# Patient Record
Sex: Male | Born: 1960 | Race: Asian | Hispanic: No | Marital: Married | Smoking: Never smoker
Health system: Southern US, Community
[De-identification: ages and names within clinical notes are randomized; demographics above are authoritative.]

## PROBLEM LIST (undated history)

## (undated) DIAGNOSIS — I10 Essential (primary) hypertension: Secondary | ICD-10-CM

---

## 2003-12-21 ENCOUNTER — Emergency Department (HOSPITAL_COMMUNITY): Admission: EM | Admit: 2003-12-21 | Discharge: 2003-12-21 | Payer: Self-pay | Admitting: Emergency Medicine

## 2004-05-01 ENCOUNTER — Ambulatory Visit: Payer: Self-pay | Admitting: Internal Medicine

## 2004-05-13 ENCOUNTER — Ambulatory Visit: Payer: Self-pay | Admitting: Family Medicine

## 2004-05-29 ENCOUNTER — Ambulatory Visit: Payer: Self-pay | Admitting: Internal Medicine

## 2004-12-01 ENCOUNTER — Ambulatory Visit: Payer: Self-pay | Admitting: Internal Medicine

## 2004-12-10 ENCOUNTER — Ambulatory Visit: Payer: Self-pay | Admitting: Family Medicine

## 2004-12-11 ENCOUNTER — Ambulatory Visit: Payer: Self-pay | Admitting: *Deleted

## 2005-03-02 ENCOUNTER — Ambulatory Visit: Payer: Self-pay | Admitting: Family Medicine

## 2005-05-05 ENCOUNTER — Ambulatory Visit: Payer: Self-pay | Admitting: Family Medicine

## 2005-09-15 ENCOUNTER — Ambulatory Visit: Payer: Self-pay | Admitting: Family Medicine

## 2006-01-15 ENCOUNTER — Ambulatory Visit: Payer: Self-pay | Admitting: Family Medicine

## 2006-01-26 ENCOUNTER — Ambulatory Visit: Payer: Self-pay | Admitting: Family Medicine

## 2006-06-04 ENCOUNTER — Ambulatory Visit: Payer: Self-pay | Admitting: Internal Medicine

## 2006-11-05 ENCOUNTER — Ambulatory Visit: Payer: Self-pay | Admitting: Internal Medicine

## 2006-11-05 ENCOUNTER — Encounter (INDEPENDENT_AMBULATORY_CARE_PROVIDER_SITE_OTHER): Payer: Self-pay | Admitting: Family Medicine

## 2006-11-05 LAB — CONVERTED CEMR LAB: Hgb A1c MFr Bld: 5.6 %

## 2006-12-13 ENCOUNTER — Encounter (INDEPENDENT_AMBULATORY_CARE_PROVIDER_SITE_OTHER): Payer: Self-pay | Admitting: Internal Medicine

## 2007-01-22 ENCOUNTER — Encounter (INDEPENDENT_AMBULATORY_CARE_PROVIDER_SITE_OTHER): Payer: Self-pay | Admitting: Family Medicine

## 2007-01-22 DIAGNOSIS — J309 Allergic rhinitis, unspecified: Secondary | ICD-10-CM | POA: Insufficient documentation

## 2007-01-22 DIAGNOSIS — E119 Type 2 diabetes mellitus without complications: Secondary | ICD-10-CM

## 2007-01-22 DIAGNOSIS — I517 Cardiomegaly: Secondary | ICD-10-CM

## 2007-01-22 DIAGNOSIS — L408 Other psoriasis: Secondary | ICD-10-CM

## 2007-01-22 DIAGNOSIS — E1165 Type 2 diabetes mellitus with hyperglycemia: Secondary | ICD-10-CM | POA: Insufficient documentation

## 2007-01-22 LAB — CONVERTED CEMR LAB
Cholesterol: 122 mg/dL
Microalb, Ur: 0.4 mg/dL

## 2007-05-18 ENCOUNTER — Encounter (INDEPENDENT_AMBULATORY_CARE_PROVIDER_SITE_OTHER): Payer: Self-pay | Admitting: *Deleted

## 2007-06-24 ENCOUNTER — Ambulatory Visit: Payer: Self-pay | Admitting: Internal Medicine

## 2007-06-24 DIAGNOSIS — K921 Melena: Secondary | ICD-10-CM

## 2007-06-24 DIAGNOSIS — F911 Conduct disorder, childhood-onset type: Secondary | ICD-10-CM | POA: Insufficient documentation

## 2007-06-24 DIAGNOSIS — L738 Other specified follicular disorders: Secondary | ICD-10-CM

## 2007-06-24 DIAGNOSIS — E785 Hyperlipidemia, unspecified: Secondary | ICD-10-CM

## 2007-06-24 DIAGNOSIS — R3915 Urgency of urination: Secondary | ICD-10-CM

## 2007-06-24 LAB — CONVERTED CEMR LAB
Bilirubin Urine: NEGATIVE
Blood in Urine, dipstick: NEGATIVE
Ketones, urine, test strip: NEGATIVE
Urobilinogen, UA: NEGATIVE
WBC Urine, dipstick: NEGATIVE

## 2007-07-05 LAB — CONVERTED CEMR LAB
ALT: 23 units/L (ref 0–53)
Basophils Absolute: 0 10*3/uL (ref 0.0–0.1)
Calcium: 8.7 mg/dL (ref 8.4–10.5)
Cholesterol: 142 mg/dL (ref 0–200)
HDL: 35 mg/dL — ABNORMAL LOW (ref >39)
LDL Cholesterol: 78 mg/dL (ref 0–99)
Lymphocytes Relative: 33 % (ref 12–46)
Lymphs Abs: 2.2 10*3/uL (ref 0.7–3.3)
Monocytes Absolute: 0.4 10*3/uL (ref 0.2–0.7)
Monocytes Relative: 6 % (ref 3–11)
Neutro Abs: 3.9 10*3/uL (ref 1.7–7.7)
Neutrophils Relative %: 60 % (ref 43–77)
Platelets: 214 10*3/uL (ref 150–400)
RBC: 5.62 M/uL (ref 4.22–5.81)
RDW: 14.7 % — ABNORMAL HIGH (ref 11.5–14.0)
Sodium: 139 meq/L (ref 135–145)
Total CHOL/HDL Ratio: 4.1
Total Protein: 7.8 g/dL (ref 6.0–8.3)
Triglycerides: 144 mg/dL (ref <150)
VLDL: 29 mg/dL (ref 0–40)
WBC: 6.6 10*3/uL (ref 4.0–10.5)

## 2007-07-08 ENCOUNTER — Ambulatory Visit: Payer: Self-pay | Admitting: Internal Medicine

## 2007-10-14 ENCOUNTER — Emergency Department (HOSPITAL_COMMUNITY): Admission: EM | Admit: 2007-10-14 | Discharge: 2007-10-14 | Payer: Self-pay | Admitting: Emergency Medicine

## 2008-09-03 ENCOUNTER — Telehealth (INDEPENDENT_AMBULATORY_CARE_PROVIDER_SITE_OTHER): Payer: Self-pay | Admitting: Internal Medicine

## 2008-09-25 ENCOUNTER — Ambulatory Visit: Payer: Self-pay | Admitting: Internal Medicine

## 2008-09-25 DIAGNOSIS — I10 Essential (primary) hypertension: Secondary | ICD-10-CM | POA: Insufficient documentation

## 2008-09-25 LAB — CONVERTED CEMR LAB
Blood Glucose, AC Bkfst: 108 mg/dL
Blood in Urine, dipstick: NEGATIVE
Ketones, urine, test strip: NEGATIVE
Specific Gravity, Urine: >=1.03

## 2008-10-02 ENCOUNTER — Encounter (INDEPENDENT_AMBULATORY_CARE_PROVIDER_SITE_OTHER): Payer: Self-pay | Admitting: Internal Medicine

## 2008-10-03 LAB — CONVERTED CEMR LAB
ALT: 26 units/L (ref 0–53)
Alkaline Phosphatase: 65 units/L (ref 39–117)
Basophils Absolute: 0 10*3/uL (ref 0.0–0.1)
CO2: 20 meq/L (ref 19–32)
Calcium: 9 mg/dL (ref 8.4–10.5)
Chloride: 106 meq/L (ref 96–112)
Cholesterol: 158 mg/dL (ref 0–200)
Eosinophils Absolute: 0.1 10*3/uL (ref 0.0–0.7)
Eosinophils Relative: 1 % (ref 0–5)
Glucose, Bld: 101 mg/dL — ABNORMAL HIGH (ref 70–99)
HCT: 49.5 % (ref 39.0–52.0)
HDL: 37 mg/dL — ABNORMAL LOW (ref >39)
LDL Cholesterol: 90 mg/dL (ref 0–99)
Lymphocytes Relative: 29 % (ref 12–46)
Neutro Abs: 4.2 10*3/uL (ref 1.7–7.7)
Platelets: 226 10*3/uL (ref 150–400)
RBC: 5.82 M/uL — ABNORMAL HIGH (ref 4.22–5.81)
RDW: 15 % (ref 11.5–15.5)
Total Bilirubin: 0.4 mg/dL (ref 0.3–1.2)
VLDL: 31 mg/dL (ref 0–40)

## 2008-10-08 ENCOUNTER — Ambulatory Visit: Payer: Self-pay | Admitting: Internal Medicine

## 2008-10-08 LAB — CONVERTED CEMR LAB
CO2: 25 meq/L (ref 19–32)
Chloride: 106 meq/L (ref 96–112)
Creatinine, Ser: 0.9 mg/dL (ref 0.40–1.50)

## 2008-11-27 ENCOUNTER — Ambulatory Visit: Payer: Self-pay | Admitting: Internal Medicine

## 2008-11-27 LAB — CONVERTED CEMR LAB
Bilirubin Urine: NEGATIVE
Blood Glucose, Fingerstick: 131
Glucose, Urine, Semiquant: NEGATIVE
Ketones, urine, test strip: NEGATIVE

## 2009-01-25 ENCOUNTER — Ambulatory Visit: Payer: Self-pay | Admitting: Internal Medicine

## 2009-02-05 LAB — CONVERTED CEMR LAB
Cholesterol: 134 mg/dL (ref 0–200)
HDL: 31 mg/dL — ABNORMAL LOW (ref >39)
LDL Cholesterol: 50 mg/dL (ref 0–99)
Microalb, Ur: 1.99 mg/dL — ABNORMAL HIGH (ref 0.00–1.89)
Total CHOL/HDL Ratio: 4.3
Triglycerides: 265 mg/dL — ABNORMAL HIGH (ref <150)

## 2009-05-24 ENCOUNTER — Ambulatory Visit: Payer: Self-pay | Admitting: Internal Medicine

## 2009-05-24 LAB — CONVERTED CEMR LAB
Blood Glucose, Fingerstick: 126
Hgb A1c MFr Bld: 7.2 %

## 2009-06-11 LAB — CONVERTED CEMR LAB: VLDL: 33 mg/dL (ref 0–40)

## 2009-10-29 ENCOUNTER — Ambulatory Visit: Payer: Self-pay | Admitting: Internal Medicine

## 2009-10-29 LAB — CONVERTED CEMR LAB
Cholesterol: 147 mg/dL (ref 0–200)
HDL: 34 mg/dL — ABNORMAL LOW (ref >39)
Hgb A1c MFr Bld: 7.3 %
LDL Cholesterol: 78 mg/dL (ref 0–99)
Total CHOL/HDL Ratio: 4.3
Triglycerides: 176 mg/dL — ABNORMAL HIGH (ref <150)
VLDL: 35 mg/dL (ref 0–40)

## 2009-11-02 ENCOUNTER — Encounter (INDEPENDENT_AMBULATORY_CARE_PROVIDER_SITE_OTHER): Payer: Self-pay | Admitting: Internal Medicine

## 2009-12-05 ENCOUNTER — Telehealth (INDEPENDENT_AMBULATORY_CARE_PROVIDER_SITE_OTHER): Payer: Self-pay | Admitting: Internal Medicine

## 2009-12-09 ENCOUNTER — Ambulatory Visit: Payer: Self-pay | Admitting: Internal Medicine

## 2010-01-22 ENCOUNTER — Telehealth (INDEPENDENT_AMBULATORY_CARE_PROVIDER_SITE_OTHER): Payer: Self-pay | Admitting: Internal Medicine

## 2010-05-01 ENCOUNTER — Ambulatory Visit: Payer: Self-pay | Admitting: Internal Medicine

## 2010-05-01 LAB — CONVERTED CEMR LAB
Albumin: 4.3 g/dL (ref 3.5–5.2)
BUN: 15 mg/dL (ref 6–23)
Basophils Absolute: 0 10*3/uL (ref 0.0–0.1)
Basophils Relative: 0 % (ref 0–1)
Blood Glucose, Fingerstick: 170
Calcium: 9.2 mg/dL (ref 8.4–10.5)
Eosinophils Relative: 2 % (ref 0–5)
Glucose, Bld: 142 mg/dL — ABNORMAL HIGH (ref 70–99)
Hgb A1c MFr Bld: 7.2 % — ABNORMAL HIGH (ref <5.7)
LDL Cholesterol: 72 mg/dL (ref 0–99)
Lymphocytes Relative: 28 % (ref 12–46)
Lymphs Abs: 2 10*3/uL (ref 0.7–4.0)
Microalb, Ur: 2.14 mg/dL — ABNORMAL HIGH (ref 0.00–1.89)
Monocytes Absolute: 0.5 10*3/uL (ref 0.1–1.0)
Neutro Abs: 4.5 10*3/uL (ref 1.7–7.7)
Neutrophils Relative %: 63 % (ref 43–77)
PSA: 0.25 ng/mL (ref 0.10–4.00)
Sodium: 138 meq/L (ref 135–145)
Total CHOL/HDL Ratio: 5.1
VLDL: 48 mg/dL — ABNORMAL HIGH (ref 0–40)

## 2010-05-07 ENCOUNTER — Encounter (INDEPENDENT_AMBULATORY_CARE_PROVIDER_SITE_OTHER): Payer: Self-pay | Admitting: Internal Medicine

## 2010-05-28 ENCOUNTER — Encounter (INDEPENDENT_AMBULATORY_CARE_PROVIDER_SITE_OTHER): Payer: Self-pay | Admitting: Internal Medicine

## 2010-06-13 ENCOUNTER — Telehealth (INDEPENDENT_AMBULATORY_CARE_PROVIDER_SITE_OTHER): Payer: Self-pay | Admitting: Internal Medicine

## 2010-08-28 ENCOUNTER — Telehealth (INDEPENDENT_AMBULATORY_CARE_PROVIDER_SITE_OTHER): Payer: Self-pay | Admitting: Internal Medicine

## 2010-10-02 NOTE — Progress Notes (Signed)
Summary: Office Visit/DEPRESSION SCREENING  Office Visit/DEPRESSION SCREENING   Imported By: Arta Bruce 05/02/2010 10:00:57  _____________________________________________________________________  External Attachment:    Type:   Image     Comment:   External Document

## 2010-10-02 NOTE — Letter (Signed)
Summary: [facility]  [facility]   Imported By: Arta Bruce 07/31/2010 15:20:38  _____________________________________________________________________  External Attachment:    Type:   Image     Comment:   External Document

## 2010-10-02 NOTE — Progress Notes (Signed)
Summary: MEDS REFILL  Phone Note Refill Request   Refills Requested: Medication #1:  METFORMIN HCL 500 MG TABS 1 by mouth two times a day  Medication #2:  LISINOPRIL 10 MG TABS 1 tab by mouth daily [facility] [address] [facility] GENETIC [facility]AND   Initial call taken by: Domenic Polite,  August 28, 2010 8:33 AM  Follow-up for Phone Call        Still has refills available -- [facility] [address] called to transfer meds from [facility] -- will be ready later today.  Pt. notified.  Dutch Quint RN  August 28, 2010 10:08 AM

## 2010-10-02 NOTE — Letter (Signed)
Summary: Lipid Letter  [facility]  [address] [place], [place] [address]   Phone: [phone]  Fax: [phone]    11/02/2009  Julian Riley [address] [place], [place]  [address]  Dear Julian Riley:  We have carefully reviewed your last lipid profile from 10/29/2009 and the results are noted below with a summary of recommendations for lipid management.    Cholesterol:       147     Goal: <200   HDL "good" Cholesterol:   34     Goal: >45   LDL "bad" Cholesterol:   78     Goal: <70   Triglycerides:       176     Goal: <150    No change in triglycerides, HDL or LDL.  Recommend starting the Lovaza and calling to schedule a repeat fasting cholesterol profile in 3 months    TLC Diet (Therapeutic Lifestyle Change): Saturated Fats & Transfatty acids should be kept < 7% of total calories ***Reduce Saturated Fats Polyunstaurated Fat can be up to 10% of total calories Monounsaturated Fat Fat can be up to 20% of total calories Total Fat should be no greater than 25-35% of total calories Carbohydrates should be 50-60% of total calories Protein should be approximately 15% of total calories Fiber should be at least 20-30 grams a day ***Increased fiber may help lower LDL Total Cholesterol should be < 200mg /day Consider adding plant stanol/sterols to diet (example: Benacol spread) ***A higher intake of unsaturated fat may reduce Triglycerides and Increase HDL    Adjunctive Measures (may lower LIPIDS and reduce risk of Heart Attack) include: Aerobic Exercise (20-30 minutes 3-4 times a week) Limit Alcohol Consumption Weight Reduction Aspirin 75-81 mg a day by mouth (if not allergic or contraindicated) Dietary Fiber 20-30 grams a day by mouth     Current Medications: 1)    Metformin Hcl 500 Mg Tabs (Metformin hcl) .Marland Kitchen.. 1 by mouth two times a day 2)    Clobetasol Propionate 0.05 %  Crea (Clobetasol propionate) .... Apply two times a day to affected areas prn 3)    Nasacort Aq  55 Mcg/act  Aers (Triamcinolone acetonide(nasal)) .... 2 sprays each nostril daily 4)    Lisinopril 10 Mg Tabs (Lisinopril) .Marland Kitchen.. 1 tab by mouth daily 5)    Claritin-d 12 Hour 5-120 Mg Xr12h-tab (Loratadine-pseudoephedrine) .Marland Kitchen.. 1 tab by mouth two times a day as needed allergies 6)    Lovaza 1 Gm Caps (Omega-3-acid ethyl esters) .... 4 caps by mouth daily  If you have any questions, please call. We appreciate being able to work with you.   Sincerely,    [facility] Julieanne Manson MD

## 2010-10-02 NOTE — Assessment & Plan Note (Signed)
Summary: CPE /TMM   Vital Signs:  Patient profile:   50 year old male Height:      69 inches (175.26 cm) Weight:      233 pounds (105.91 kg) BMI:     34.53 Temp:     97.8 degrees F (36.56 degrees C) oral Pulse rate:   72 / minute Pulse rhythm:   regular Resp:     20 per minute BP sitting:   122 / 80  (left arm)  Vitals Entered By: CMA Student Linzie Collin CC: office visit CPE Is Patient Diabetic? Yes Pain Assessment Patient in pain? no      CBG Result 170  Does patient need assistance? Functional Status Self care Ambulation Normal   CC:  office visit CPE.  History of Present Illness: 50 yo male here for CPE.  Current Medications (verified): 1)  Metformin Hcl 500 Mg Tabs (Metformin Hcl) .Marland Kitchen.. 1 By Mouth Two Times A Day 2)  Clobetasol Propionate 0.05 %  Crea (Clobetasol Propionate) .... Apply Two Times A Day To Affected Areas Prn 3)  Nasacort Aq 55 Mcg/act  Aers (Triamcinolone Acetonide(Nasal)) .... 2 Sprays Each Nostril Daily 4)  Lisinopril 10 Mg Tabs (Lisinopril) .Marland Kitchen.. 1 Tab By Mouth Daily 5)  Xyzal 5 Mg Tabs (Levocetirizine Dihydrochloride) .Marland Kitchen.. 1 Tab By Mouth Daily As Needed Allergies 6)  Lovaza 1 Gm Caps (Omega-3-Acid Ethyl Esters) .... 4 Caps By Mouth Daily  Allergies (verified): No Known Drug Allergies  Past History:  Past Medical History: Reviewed history from 11/27/2008 and no changes required. 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 . Eyes:  Stable--wears glasses.  To make an appt. with [facility].. ENT:  Denies decreased hearing. CV:  Denies chest pain or discomfort and palpitations. Resp:  Denies cough and shortness of breath. GI:  Denies abdominal pain, bloody stools, constipation, dark tarry stools, and diarrhea. GU:  Denies dysuria, nocturia, urinary frequency, and urinary hesitancy. MS:  Denies joint pain, joint redness, and joint swelling; Rare discomfort in left low back. Derm:  Dermatitis on feet, elbows, hands and knees, toes where has skin folds.. Neuro:  Denies numbness, tingling, and weakness. Psych:  Denies anxiety, depression, and suicidal thoughts/plans; PHQ9 scored 0.  Physical Exam  General:  Obese male, NAD Head:  Normocephalic and atraumatic without obvious abnormalities. No apparent alopecia or balding. Eyes:  Bilateral cataracts--quite large--difficult to visualize discs Ears:  External ear exam shows no significant lesions or deformities.  Otoscopic examination reveals clear canals, tympanic membranes are intact bilaterally without bulging, retraction, inflammation or discharge. Hearing is grossly normal bilaterally. Nose:  External nasal examination shows no deformity or inflammation. Nasal mucosa are pink and moist without lesions or exudates. Mouth:  Oral mucosa and oropharynx without lesions or exudates.  Teeth in good repair. Neck:  No deformities,  masses, or tenderness noted. Chest Wall:  No deformities, masses, tenderness or gynecomastia noted. Lungs:  Normal respiratory effort, chest  expands symmetrically. Lungs are clear to auscultation, no crackles or wheezes. Heart:  Normal rate and regular rhythm. S1 and S2 normal without gallop, murmur, click, rub or other extra sounds. Abdomen:  Bowel sounds positive,abdomen soft and non-tender without masses, organomegaly or hernias noted. Rectal:  No external abnormalities noted. Normal sphincter tone. No rectal masses or tenderness.  Heme negative light brown stool. Genitalia:  Testes bilaterally descended without nodularity, tenderness or masses. No scrotal masses or lesions. No penis lesions or urethral discharge. Prostate:  Prostate gland firm and smooth, no enlargement, nodularity, tenderness, mass, asymmetry or induration. Msk:  No deformity or scoliosis noted of thoracic or lumbar spine.   Pulses:  R and L carotid,radial,femoral,dorsalis pedis and posterior tibial pulses are full and equal bilaterally Extremities:  No clubbing, cyanosis, edema, or deformity noted with normal full range of motion of all joints.   Neurologic:  No cranial nerve deficits noted. Station and gait are normal. Plantar reflexes are down-going bilaterally. DTRs are symmetrical throughout. Sensory, motor and coordinative functions appear intact. Skin:  thickened, lichenified and flaky skin at skin folds of anterior ankle, elbows, knees, wrists, great toes and over dorsal hands--MCP joint and PIP joints  1 cm dark brown, well circumbscribed partially raised lesion on right abdomen--unchanged since a child per pt. Cervical Nodes:  No lymphadenopathy noted Axillary Nodes:  No palpable lymphadenopathy Inguinal Nodes:  No significant adenopathy Psych:  Cognition and judgment appear intact. Alert and cooperative with normal attention span and concentration. No apparent delusions, illusions, hallucinations  Diabetes Management Exam:    Foot Exam (with socks and/or shoes not present):       Sensory-Monofilament:          Left foot: normal          Right foot:  normal   Impression & Recommendations:  Problem # 1:  HEALTH MAINTENANCE EXAM (ICD-V70.0) Guaiac cards x 3 to return in 2 weeks. see labs below  Problem # 2:  HEALTH SCREENING (ICD-V70.0)  Orders: T- GC Chlamydia (04540) T-HIV Antibody  (Reflex) ([id number]) T-Syphilis Test (RPR) (95621-30865) T-PSA (78469-62952)  Problem # 3:  DYSLIPIDEMIA (ICD-272.4)  His updated medication list for this problem includes:    Lovaza 1 Gm Caps (Omega-3-acid ethyl esters) .Marland KitchenMarland KitchenMarland KitchenMarland Kitchen 4 caps by mouth daily  Orders: T-Lipid Profile (84132-44010)  Problem # 4:  PSORIASIS (ICD-696.1) Vs eczema:  not controlled Refill Clobetasol  Problem # 5:  DIABETES MELLITUS, TYPE II (ICD-250.00) Encouraged eye exam for cataracts and to assess for diabetic changes. Encouraged to check back for flu vaccine. His updated medication list for this problem includes:    Metformin Hcl 500 Mg Tabs (Metformin hcl) .Marland Kitchen... 1 by mouth two times a day    Lisinopril 10 Mg Tabs (Lisinopril) .Marland Kitchen... 1 tab by mouth daily  Orders: T-Comprehensive Metabolic Panel [phone]) T-CBC w/Diff ([phone]) T- Hemoglobin A1C (87564-33295) T-Urine Microalbumin w/creat. ratio [id number])  Problem # 6:  HEMOCCULT POSITIVE STOOL (ICD-578.1) This with exam in 2008--never able to get pt. in with GI. Heme negative today. No new GI symptoms Check CBC  Problem # 7:  HYPERTENSION, ESSENTIAL (ICD-401.9) Controlled His updated medication list for this problem includes:    Lisinopril 10 Mg Tabs (Lisinopril) .Marland Kitchen... 1 tab by mouth daily  Complete Medication List: 1)  Metformin Hcl 500 Mg Tabs (Metformin hcl) .Marland Kitchen.. 1 by mouth two times a day  2)  Clobetasol Propionate 0.05 % Crea (Clobetasol propionate) .... Apply two times a day to affected areas prn 3)  Nasacort Aq 55 Mcg/act Aers (Triamcinolone acetonide(nasal)) .... 2 sprays each nostril daily 4)  Lisinopril 10 Mg Tabs (Lisinopril) .Marland Kitchen.. 1 tab by mouth daily 5)  Xyzal 5 Mg Tabs  (Levocetirizine dihydrochloride) .Marland Kitchen.. 1 tab by mouth daily as needed allergies 6)  Lovaza 1 Gm Caps (Omega-3-acid ethyl esters) .... 4 caps by mouth daily  Patient Instructions: 1)  Get eye exam soon 2)  Follow up with Dr. Delrae Alfred in 6 months --DM, htn Prescriptions: LOVAZA 1 GM CAPS (OMEGA-3-ACID ETHYL ESTERS) 4 caps by mouth daily  #120 x 11   Entered and Authorized by:   Julieanne Manson MD   Signed by:   Julieanne Manson MD on 05/01/2010   Method used:   Faxed to ...       [facility] (retail)       [address] [place], [place]  [address]       Ph: [phone]       Fax: [phone]   RxID:   [id number] XYZAL 5 MG TABS (LEVOCETIRIZINE DIHYDROCHLORIDE) 1 tab by mouth daily as needed allergies  #30 x 11   Entered and Authorized by:   Julieanne Manson MD   Signed by:   Julieanne Manson MD on 05/01/2010   Method used:   Faxed to ...       [facility] (retail)       [address] [place], [place]  [address]       Ph: [phone]       Fax: [phone]   RxID:   [id number] LISINOPRIL 10 MG TABS (LISINOPRIL) 1 tab by mouth daily  #30 x 6   Entered and Authorized by:   Julieanne Manson MD   Signed by:   Julieanne Manson MD on 05/01/2010   Method used:   Faxed to ...       [facility] - [facility] (retail)       [address] [place], [place]  [address]       Ph: [phone]       Fax: [phone]   RxID:   [id number] NASACORT AQ 55 MCG/ACT  AERS (TRIAMCINOLONE ACETONIDE(NASAL)) 2 sprays each nostril daily  #1 x 11   Entered and Authorized by:   Julieanne Manson MD   Signed by:   Julieanne Manson MD on 05/01/2010   Method used:   Faxed to ...       [facility] - [facility] (retail)       [address] [place], [place]  [address]       Ph: [phone]       Fax: [phone]   RxID:    [id number] CLOBETASOL PROPIONATE 0.05 %  CREA (CLOBETASOL PROPIONATE) Apply two times a day to affected areas prn  #60g x 2   Entered and Authorized by:   Julieanne Manson MD   Signed by:   Julieanne Manson MD on 05/01/2010   Method used:   Faxed to ...       [facility] - [facility] (retail)       [address] [place], [place]  [address]  Ph: [phone]       Fax: [phone]   RxID:   [id number] METFORMIN HCL 500 MG TABS (METFORMIN HCL) 1 by mouth two times a day  #60 x 11   Entered and Authorized by:   Julieanne Manson MD   Signed by:   Julieanne Manson MD on 05/01/2010   Method used:   Faxed to ...       [facility] - [facility] (retail)       [address] [place], [place]  [address]       Ph: [phone]       Fax: [phone]   RxID:   [id number]    Preventive Care Screening     Eye exam through [facility]:  has not been to them for 2 years--planning to go soon. Guaiac Cards:  has never returned Colonoscopy:  never--attempted to send in 2008 with heme positive stool on CPE, but never occurred.  CBC has been normal. PSA:  never STE:  Checks regularly--no changes.  Last LDL:                                                 78 (10/29/2009 11:30:00 PM)        Diabetic Foot Exam    10-g (5.07) Semmes-Weinstein Monofilament Test Performed by: CMA Student Kenyatta Jeffries          Right Foot          Left Foot Visual Inspection               Test Control      normal         normal Site 1         normal         normal Site 2         normal         normal Site 3         normal         normal Site 4         normal         normal Site 5         normal         normal Site 6         normal         normal Site 7         normal         normal Site 8         normal         normal Site 9         normal         normal Site 10         normal          normal  Impression      normal         normal

## 2010-10-02 NOTE — Progress Notes (Signed)
Summary: ASK QUESTION ABOUT MEDS  Phone Note Call from Patient   Caller: Patient Summary of Call: PT NEED TO TALK TO YOU ABOUT HE IS NOT GOING TO HAVE ORANGE CARD , HI HAS A QUESTION FOR YOU ABOUT MEDICINE COST PLEASE CALL HIM AT [phone]. Initial call taken by: Domenic Polite,  June 13, 2010 11:33 AM  Follow-up for Phone Call        Please find out what his questions are specifically. Tereso Newcomer PA-C  June 16, 2010 1:51 PM   Queried costs of office visit without orange card and cost of meds without orange card.  Advised he could check with the health department and [facility]'s $4.00 Formulary. Follow-up by: Dutch Quint RN,  June 17, 2010 4:31 PM

## 2010-10-02 NOTE — Progress Notes (Signed)
Summary: NEEDS REFILL  Medications Added XYZAL 5 MG TABS (LEVOCETIRIZINE DIHYDROCHLORIDE) 1 tab by mouth daily as needed allergies       Phone Note Call from Patient Call back at Home Phone [phone]   Reason for Call: Refill Medication Summary of Call: Julian Riley PT. MR Dasani SAYS THAT HE CALLED [facility] FOR A REFILL ON HIS NASACORT, AND THEY TOLD HIM TO CALL us, AND HE WANTS TO LET YOU KNOW THAT HIS ALLEGRA IS NOT WORKING AND HE IS TAKING IT TWICE A DAY, BUT HE WOULD LIKE SOMETHING DIFFERENT. Initial call taken by: Leodis Rains,  Jan 22, 2010 12:00 PM  Follow-up for Phone Call        Sent to Dr. Delrae Alfred.  Dutch Quint RN  Jan 23, 2010 12:32 PM   Additional Follow-up for Phone Call Additional follow up Details #1::        let him know he is switched to Xyzal--let me know if no improvement Additional Follow-up by: Julieanne Manson MD,  Jan 23, 2010 6:01 PM    Additional Follow-up for Phone Call Additional follow up Details #2::    Pt notified. Follow-up by: Vesta Mixer CMA,  Jan 24, 2010 5:45 PM  New/Updated Medications: XYZAL 5 MG TABS (LEVOCETIRIZINE DIHYDROCHLORIDE) 1 tab by mouth daily as needed allergies Prescriptions: NASACORT AQ 55 MCG/ACT  AERS (TRIAMCINOLONE ACETONIDE(NASAL)) 2 sprays each nostril daily  #1 x 11   Entered and Authorized by:   Julieanne Manson MD   Signed by:   Julieanne Manson MD on 01/23/2010   Method used:   Faxed to ...       [facility] (retail)       [address] [place], [place]  [address]       Ph: [phone]       Fax: [phone]   RxID:   [id number] XYZAL 5 MG TABS (LEVOCETIRIZINE DIHYDROCHLORIDE) 1 tab by mouth daily as needed allergies  #30 x 11   Entered and Authorized by:   Julieanne Manson MD   Signed by:   Julieanne Manson MD on 01/23/2010   Method used:   Faxed to ...       [facility] (retail)       [address]  [place], [place]  [address]       Ph: [phone]       Fax: [phone]   RxID:   [id number]

## 2010-10-02 NOTE — Letter (Signed)
Summary: Lipid Letter  [facility]  [address] [place], [place] [address]   Phone: [phone]  Fax: [phone]    05/28/2010  Rahsaan Weakland [address] [place], [place]  [address]  Dear Julian Riley:  We have carefully reviewed your last lipid profile from 05/01/2010 and the results are noted below with a summary of recommendations for lipid management.    Cholesterol:       149     Goal: <200   HDL "good" Cholesterol:   29     Goal: >45   LDL "bad" Cholesterol:   72     Goal: <70   Triglycerides:       242     Goal: <150     It does not look like you are taking the Lovaza--your good cholesterol is getting lower and your triglycerides higher.  Make sure you are using regularly.  The rest of your labs were fine.  Call to make an appt. to recheck your cholesterol in 6 months--lab visit only, for which you should be fasting.     TLC Diet (Therapeutic Lifestyle Change): Saturated Fats & Transfatty acids should be kept < 7% of total calories ***Reduce Saturated Fats Polyunstaurated Fat can be up to 10% of total calories Monounsaturated Fat Fat can be up to 20% of total calories Total Fat should be no greater than 25-35% of total calories Carbohydrates should be 50-60% of total calories Protein should be approximately 15% of total calories Fiber should be at least 20-30 grams a day ***Increased fiber may help lower LDL Total Cholesterol should be < 200mg /day Consider adding plant stanol/sterols to diet (example: Benacol spread) ***A higher intake of unsaturated fat may reduce Triglycerides and Increase HDL    Adjunctive Measures (may lower LIPIDS and reduce risk of Heart Attack) include: Aerobic Exercise (20-30 minutes 3-4 times a week) Limit Alcohol Consumption Weight Reduction Aspirin 75-81 mg a day by mouth (if not allergic or contraindicated) Dietary Fiber 20-30 grams a day by mouth     Current Medications: 1)    Metformin Hcl 500 Mg Tabs  (Metformin hcl) .Marland Kitchen.. 1 by mouth two times a day 2)    Clobetasol Propionate 0.05 %  Crea (Clobetasol propionate) .... Apply two times a day to affected areas prn 3)    Nasacort Aq 55 Mcg/act  Aers (Triamcinolone acetonide(nasal)) .... 2 sprays each nostril daily 4)    Lisinopril 10 Mg Tabs (Lisinopril) .Marland Kitchen.. 1 tab by mouth daily 5)    Xyzal 5 Mg Tabs (Levocetirizine dihydrochloride) .Marland Kitchen.. 1 tab by mouth daily as needed allergies 6)    Lovaza 1 Gm Caps (Omega-3-acid ethyl esters) .... 4 caps by mouth daily  If you have any questions, please call. We appreciate being able to work with you.   Sincerely,    [facility] Julieanne Manson MD

## 2010-10-02 NOTE — Assessment & Plan Note (Signed)
Summary: follow up with Dr Delrae Alfred in 4 months//gk   Vital Signs:  Patient profile:   50 year old male Weight:      234 pounds BSA:     2.21 Temp:     98.1 degrees F oral Pulse rate:   86 / minute Pulse rhythm:   regular Resp:     20 per minute BP sitting:   155 / 98  (left arm) Cuff size:   regular  Vitals Entered By: Gaylyn Cheers RN (October 29, 2009 8:35 AM)  CC: denies chest pain, SOB, dizziness, HA takes BP med @ noon. Did not bring meds, missed taking meds several days last week Is Patient Diabetic? Yes Did you bring your meter with you today? No Pain Assessment Patient in pain? no      CBG Result 122 CBG Device ID A  Does patient need assistance? Functional Status Self care Ambulation Normal   CC:  denies chest pain, SOB, dizziness, HA takes BP med @ noon. Did not bring meds, and missed taking meds several days last week.  History of Present Illness: 1.  Hypertension:  Micah Flesher out of town for several days and forgot meds--last week.  Sounds like he does not take regularly anyway.  2.  Dyslipidemia:  Has the Lovaza, but restarted exercise and wanted to see what his cholesterol is without the Lovaza.  Fasting this morning.    3.  DM:  Not checking sugars.  States he could improve his diet.  Plans to go get an eye check this April.  [facility].  No numbness and tingling in hands or feet.    4.  Allergies:  doing okay with blooming.  Using both Allegra and Nasocort.   Allergies (verified): No Known Drug Allergies  Physical Exam  General:  NAD Lungs:  Normal respiratory effort, chest expands symmetrically. Lungs are clear to auscultation, no crackles or wheezes. Heart:  Normal rate and regular rhythm. S1 and S2 normal without gallop, murmur, click, rub or other extra sounds.  Radial pulses normal and equal. Extremities:  No edema  Diabetes Management Exam:    Foot Exam (with socks and/or shoes not present):       Sensory-Monofilament:          Left  foot: normal          Right foot: normal   Impression & Recommendations:  Problem # 1:  HYPERTENSION, ESSENTIAL (ICD-401.9) Encouraged pt. to take meds regularly His updated medication list for this problem includes:    Lisinopril 10 Mg Tabs (Lisinopril) .Marland Kitchen... 1 tab by mouth daily  Problem # 2:  DYSLIPIDEMIA (ICD-272.4) If still not at goal, pt. agrees to start Lovaza His updated medication list for this problem includes:    Lovaza 1 Gm Caps (Omega-3-acid ethyl esters) .Marland KitchenMarland KitchenMarland KitchenMarland Kitchen 4 caps by mouth daily  Orders: T-Lipid Profile (13086-57846)  Problem # 3:  HEMOCCULT POSITIVE STOOL (ICD-578.1) Still has not performed hemoccult cards--states he has one more to fill out and will bring in.  Problem # 4:  DIABETES MELLITUS, TYPE II (ICD-250.00) Diabetic Foot exam Flu shot. To work on diet and exercise--sounds like has been eating fair amt of sweets.  His updated medication list for this problem includes:    Metformin Hcl 500 Mg Tabs (Metformin hcl) .Marland Kitchen... 1 by mouth two times a day    Lisinopril 10 Mg Tabs (Lisinopril) .Marland Kitchen... 1 tab by mouth daily  Orders: Capillary Blood Glucose/CBG (82948) Hemoglobin A1C (83036)  Problem # 5:  ALLERGIC RHINITIS (ICD-477.9) Controlled. His updated medication list for this problem includes:    Nasacort Aq 55 Mcg/act Aers (Triamcinolone acetonide(nasal)) .Marland Kitchen... 2 sprays each nostril daily  Complete Medication List: 1)  Metformin Hcl 500 Mg Tabs (Metformin hcl) .Marland Kitchen.. 1 by mouth two times a day 2)  Clobetasol Propionate 0.05 % Crea (Clobetasol propionate) .... Apply two times a day to affected areas prn 3)  Nasacort Aq 55 Mcg/act Aers (Triamcinolone acetonide(nasal)) .... 2 sprays each nostril daily 4)  Lisinopril 10 Mg Tabs (Lisinopril) .Marland Kitchen.. 1 tab by mouth daily 5)  Claritin-d 12 Hour 5-120 Mg Xr12h-tab (Loratadine-pseudoephedrine) .Marland Kitchen.. 1 tab by mouth two times a day as needed allergies 6)  Lovaza 1 Gm Caps (Omega-3-acid ethyl esters) .... 4 caps by mouth  daily  Other Orders: Flu Vaccine 82yrs + (01601) Admin 1st Vaccine (09323) Admin 1st Vaccine [facility]) [phone])  Patient Instructions: 1)  nurse visit for bp check in 1 month--do not miss meds. 2)  CPE with Dr. Delrae Alfred in 4 months 3)  Bring in stool cards in next week.   Influenza Vaccine    Vaccine Type: Fluvax 3+    Site: right deltoid    Mfr: Sanofi Pasteur    Dose: 0.5 ml    Route: IM    Given by: Gaylyn Cheers RN    Exp. Date: 02/27/2010    Lot #: [id number]    VIS given: 03/24/07 version given October 29, 2009.  Flu Vaccine Consent Questions    Do you have a history of severe allergic reactions to this vaccine? no    Any prior history of allergic reactions to egg and/or gelatin? no    Do you have a sensitivity to the preservative Thimersol? no    Do you have a past history of Guillan-Barre Syndrome? no    Do you currently have an acute febrile illness? no    Have you ever had a severe reaction to latex? no    Vaccine information given and explained to patient? yes  Laboratory Results   Blood Tests   Date/Time Received: October 29, 2009 9:48 AM  Date/Time Reported: October 29, 2009 9:48 AM   HGBA1C: 7.3%   (Normal Range: Non-Diabetic - 3-6%   Control Diabetic - 6-8%) CBG Random:: 122         Last LDL:                                                 76 (05/24/2009 9:32:00 PM)          Diabetic Foot Exam Foot Inspection Is there a history of a foot ulcer?              No Is there a foot ulcer now?              No Is there swelling or an abnormal foot shape?          No Are the toenails long?                No Are the toenails thick?                No Are the toenails ingrown?              No Is there heavy callous build-up?  No Is there a claw toe deformity?                          No Is there elevated skin temperature?            No Is there limited ankle dorsiflexion?            No Is there foot or ankle muscle weakness?            No Do you  have pain in calf while walking?           No         10-g (5.07) Semmes-Weinstein Monofilament Test Performed by: Gaylyn Cheers RN          Right Foot          Left Foot Visual Inspection               Test Control      normal         normal Site 1         normal         normal Site 2         normal         normal Site 3         normal         normal Site 4         normal         normal Site 5         normal         normal Site 6         normal         normal Site 7         normal         normal Site 8         normal         normal Site 9         normal         normal Site 10         normal         normal  Impression      normal         normal

## 2010-10-02 NOTE — Progress Notes (Signed)
Summary: Refills   Phone Note Call from Patient Call back at [facility] Phone [phone] Call back at [phone]   Summary of Call: The pt needs more refills from medformin medication [facility]) Delrae Alfred mD Initial call taken by: Manon Hilding,  December 05, 2009 11:41 AM  Follow-up for Phone Call        Pt requesting refills of Metformin be sent to [facility] pharmacy. Follow-up by: Vesta Mixer CMA,  December 05, 2009 11:45 AM    Prescriptions: METFORMIN HCL 500 MG TABS (METFORMIN HCL) 1 by mouth two times a day  #60 x 11   Entered and Authorized by:   Julieanne Manson MD   Signed by:   Julieanne Manson MD on 12/05/2009   Method used:   Faxed to ...       [facility] (retail)       [address] [place], [place]  [address]       Ph: [phone]       Fax: [phone]   RxID:   [id number]

## 2011-02-06 ENCOUNTER — Other Ambulatory Visit: Payer: Self-pay | Admitting: Specialist

## 2011-02-06 ENCOUNTER — Ambulatory Visit
Admission: RE | Admit: 2011-02-06 | Discharge: 2011-02-06 | Disposition: A | Discharge status: Home / Self Care | Payer: Self-pay | Source: Ambulatory Visit | Attending: Specialist | Admitting: Specialist

## 2011-02-06 DIAGNOSIS — R6889 Other general symptoms and signs: Secondary | ICD-10-CM

## 2011-10-11 IMAGING — CR DG CHEST 1V
1 series · 1 of 1 positions shown · non-contrast
Comparison: Chest x-ray of 12/21/2003

CLINICAL DATA: Positive test for TB

CHEST - 1 VIEW

[w chest pa]
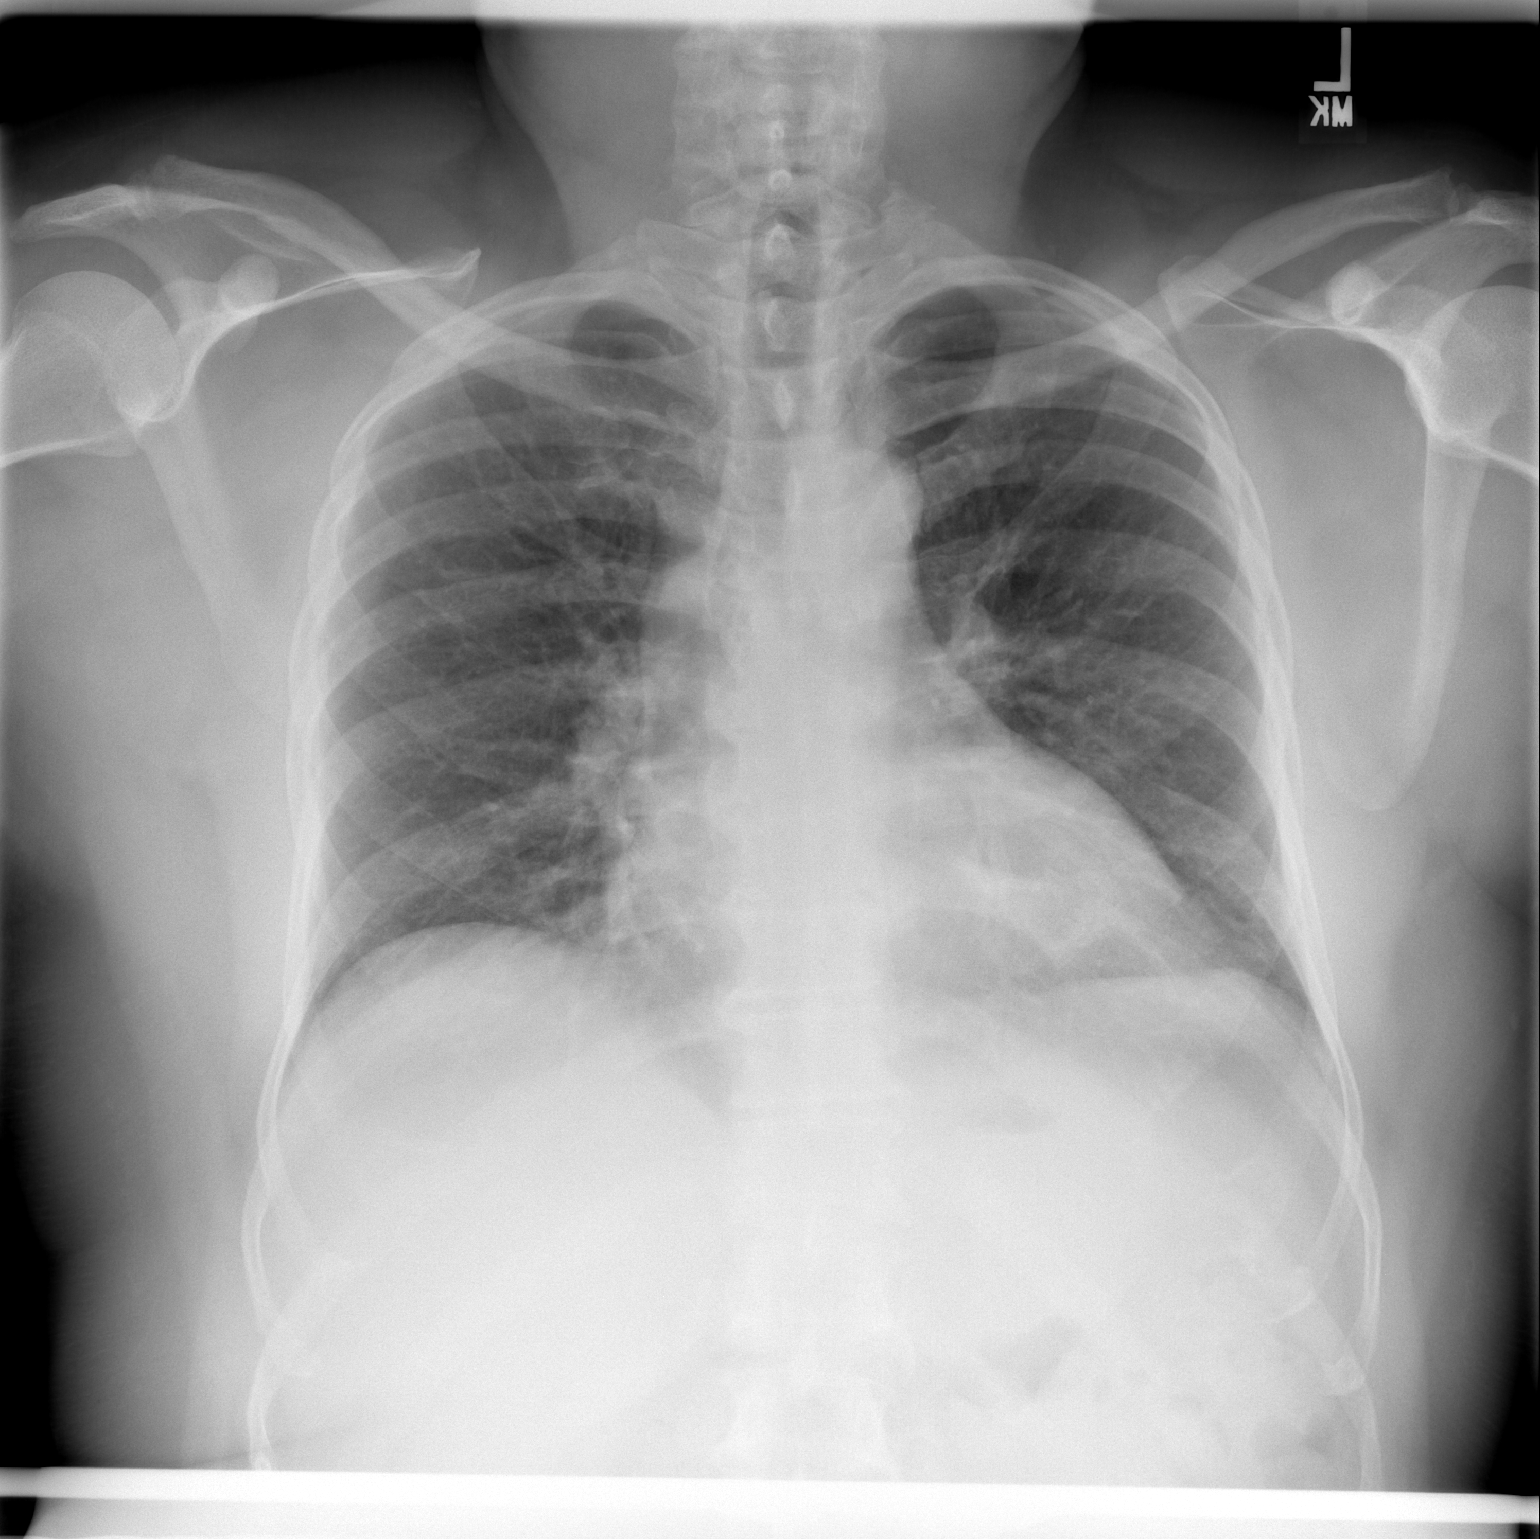

[1 of 1 positions shown; findings below may reference images not displayed]

FINDINGS: No active infiltrate or effusion is seen.  Cardiomegaly
is stable.  There are degenerative changes throughout the thoracic
spine.
IMPRESSION: Stable cardiomegaly and no active lung disease.

## 2012-03-16 ENCOUNTER — Encounter (HOSPITAL_COMMUNITY): Payer: Self-pay | Admitting: Emergency Medicine

## 2012-03-16 ENCOUNTER — Emergency Department (INDEPENDENT_AMBULATORY_CARE_PROVIDER_SITE_OTHER)
Admission: EM | Admit: 2012-03-16 | Discharge: 2012-03-16 | Disposition: A | Discharge status: Home / Self Care | Payer: Self-pay | Source: Home / Self Care | Attending: Emergency Medicine | Admitting: Emergency Medicine

## 2012-03-16 ENCOUNTER — Emergency Department (INDEPENDENT_AMBULATORY_CARE_PROVIDER_SITE_OTHER): Payer: Self-pay

## 2012-03-16 DIAGNOSIS — J40 Bronchitis, not specified as acute or chronic: Secondary | ICD-10-CM

## 2012-03-16 HISTORY — DX: Essential (primary) hypertension: I10

## 2012-03-16 MED ORDER — PREDNISONE 10 MG PO TABS: 50.0000 mg | ORAL_TABLET | Freq: Every day | ORAL | Status: DC

## 2012-03-16 NOTE — ED Provider Notes (Signed)
History     CSN: [id number]  Arrival date & time 03/16/12  1114   First MD Initiated Contact with Patient 03/16/12 1304      Chief Complaint  Patient presents with  . Julian Riley    (Consider location/radiation/quality/duration/timing/severity/associated sxs/prior treatment) HPI Comments: Pt feels like he has bronchitis.  Had several years ago and feels the same.  C/o dry Julian Riley, worse at night when he is lying down.   Patient is a 51 y.o. male presenting with Julian Riley. The history is provided by the patient.  Julian Riley This is a new problem. Episode onset: a month ago. The problem occurs every few minutes. The problem has been gradually worsening. The Julian Riley is non-productive. There has been no fever. Pertinent negatives include no chest pain, no chills, no ear congestion, no ear pain, no rhinorrhea, no sore throat, no shortness of breath and no wheezing. Treatments tried: leftover antibiotics  The treatment provided no (they helped while he was taking them, then he ran out and sx continued to worsen) relief. He is not a smoker. His past medical history is significant for bronchitis.    Past Medical History  Diagnosis Date  . Diabetes mellitus   . Hypertension     History reviewed. No pertinent past surgical history.  History reviewed. No pertinent family history.  History  Substance Use Topics  . Smoking status: Never Smoker   . Smokeless tobacco: Not on file  . Alcohol Use: No      Review of Systems  Constitutional: Negative for fever and chills.  HENT: Negative for ear pain, sore throat, rhinorrhea and postnasal drip.   Respiratory: Positive for Julian Riley. Negative for shortness of breath and wheezing.   Cardiovascular: Negative for chest pain.    Allergies  Review of patient's allergies indicates no known allergies.  Home Medications   Current Outpatient Rx  Name Route Sig Dispense Refill  . ASPIRIN BUFFERED 325 MG PO TABS Oral Take 325 mg by mouth daily.    Marland Kitchen LISINOPRIL PO  Oral Take by mouth.    . METFORMIN HCL PO Oral Take by mouth.    Marland Kitchen OVER THE COUNTER MEDICATION  Robitussin and theraflu    . PREDNISONE 10 MG PO TABS Oral Take 5 tablets (50 mg total) by mouth daily. 25 tablet 0    BP 117/78  Pulse 97  Temp 98 F (36.7 C) (Oral)  Resp 16  SpO2 95%  Physical Exam  Constitutional: He appears well-developed and well-nourished. No distress.  HENT:  Right Ear: Tympanic membrane, external ear and ear canal normal.  Left Ear: External ear normal.  Nose: Nose normal. No mucosal edema or rhinorrhea.  Mouth/Throat: Oropharynx is clear and moist.       L ear canal with cerumen impaction, unable to see TM  Cardiovascular: Normal rate and regular rhythm.   Pulmonary/Chest: Effort normal and breath sounds normal.    ED Course  Procedures (including critical care time)  Labs Reviewed - No data to display Dg Chest 2 View  03/16/2012  *RADIOLOGY REPORT*  Clinical Data: Julian Riley.  CHEST - 2 VIEW  Comparison: 02/06/2011  Findings: Heart is borderline in size.  Mild peribronchial thickening.  No confluent opacities or effusions.  No acute bony abnormality.  IMPRESSION: Mild bronchitic changes.  Original Report Authenticated By: Cyndie Chime, M.D.     1. Bronchitis       MDM  Pt sx and cxr c/w mild bronchitis, will tx.  PT requests cortisone as "  it worked for me the last time I had bronchitis"        Cathlyn Parsons, NP 03/16/12 1317

## 2012-03-16 NOTE — ED Notes (Signed)
Cough and congestion for one month. Non-productive cough, worse at night or with a lot of talking.  Denies fever .  Intermittent headaches and intermittent mid-back pain.

## 2012-03-29 NOTE — ED Provider Notes (Signed)
Medical screening examination/treatment/procedure(s) were performed by non-physician practitioner and as supervising physician I was immediately available for consultation/collaboration.  Luiz Blare MD   Luiz Blare, MD 03/29/12 [phone]

## 2012-11-18 IMAGING — CR DG CHEST 2V
2 series · 2 of 2 positions shown · non-contrast
Comparison: 02/06/2011

CLINICAL DATA: Cough.

CHEST - 2 VIEW

[view not recorded (1 of 2)]
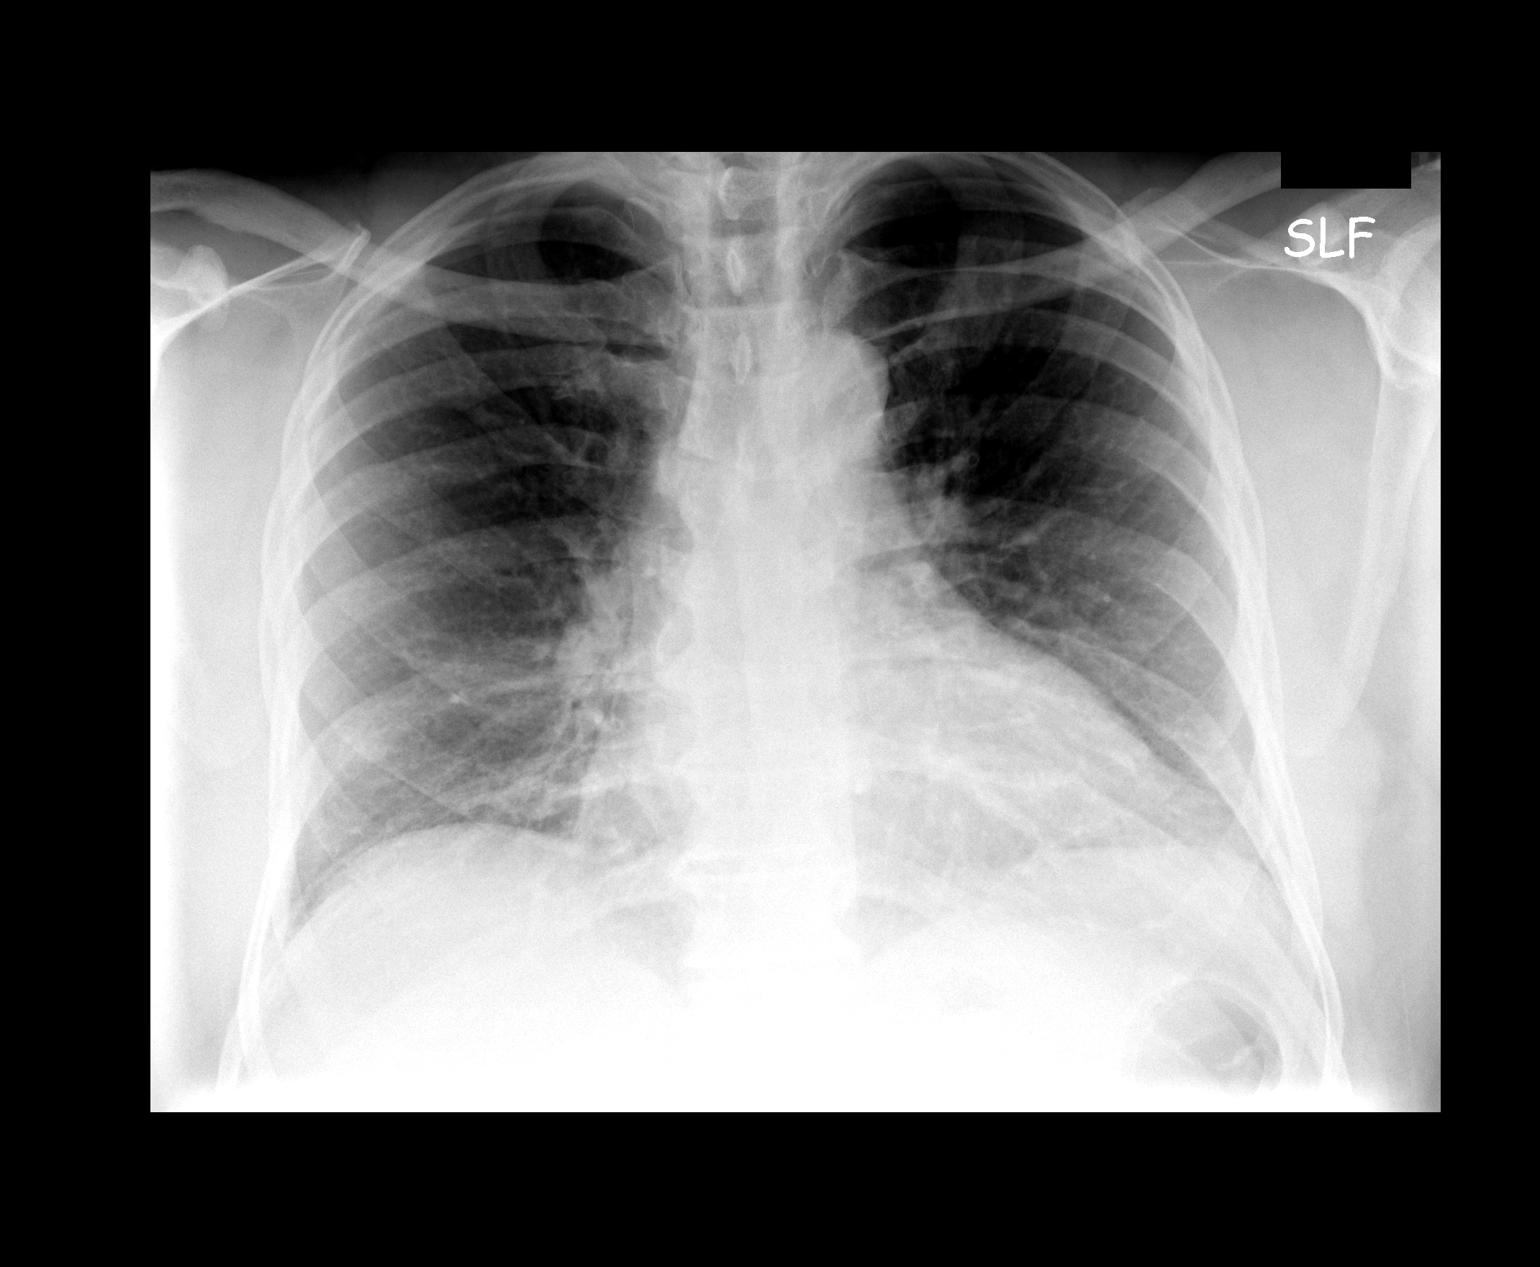

[view not recorded (2 of 2)]
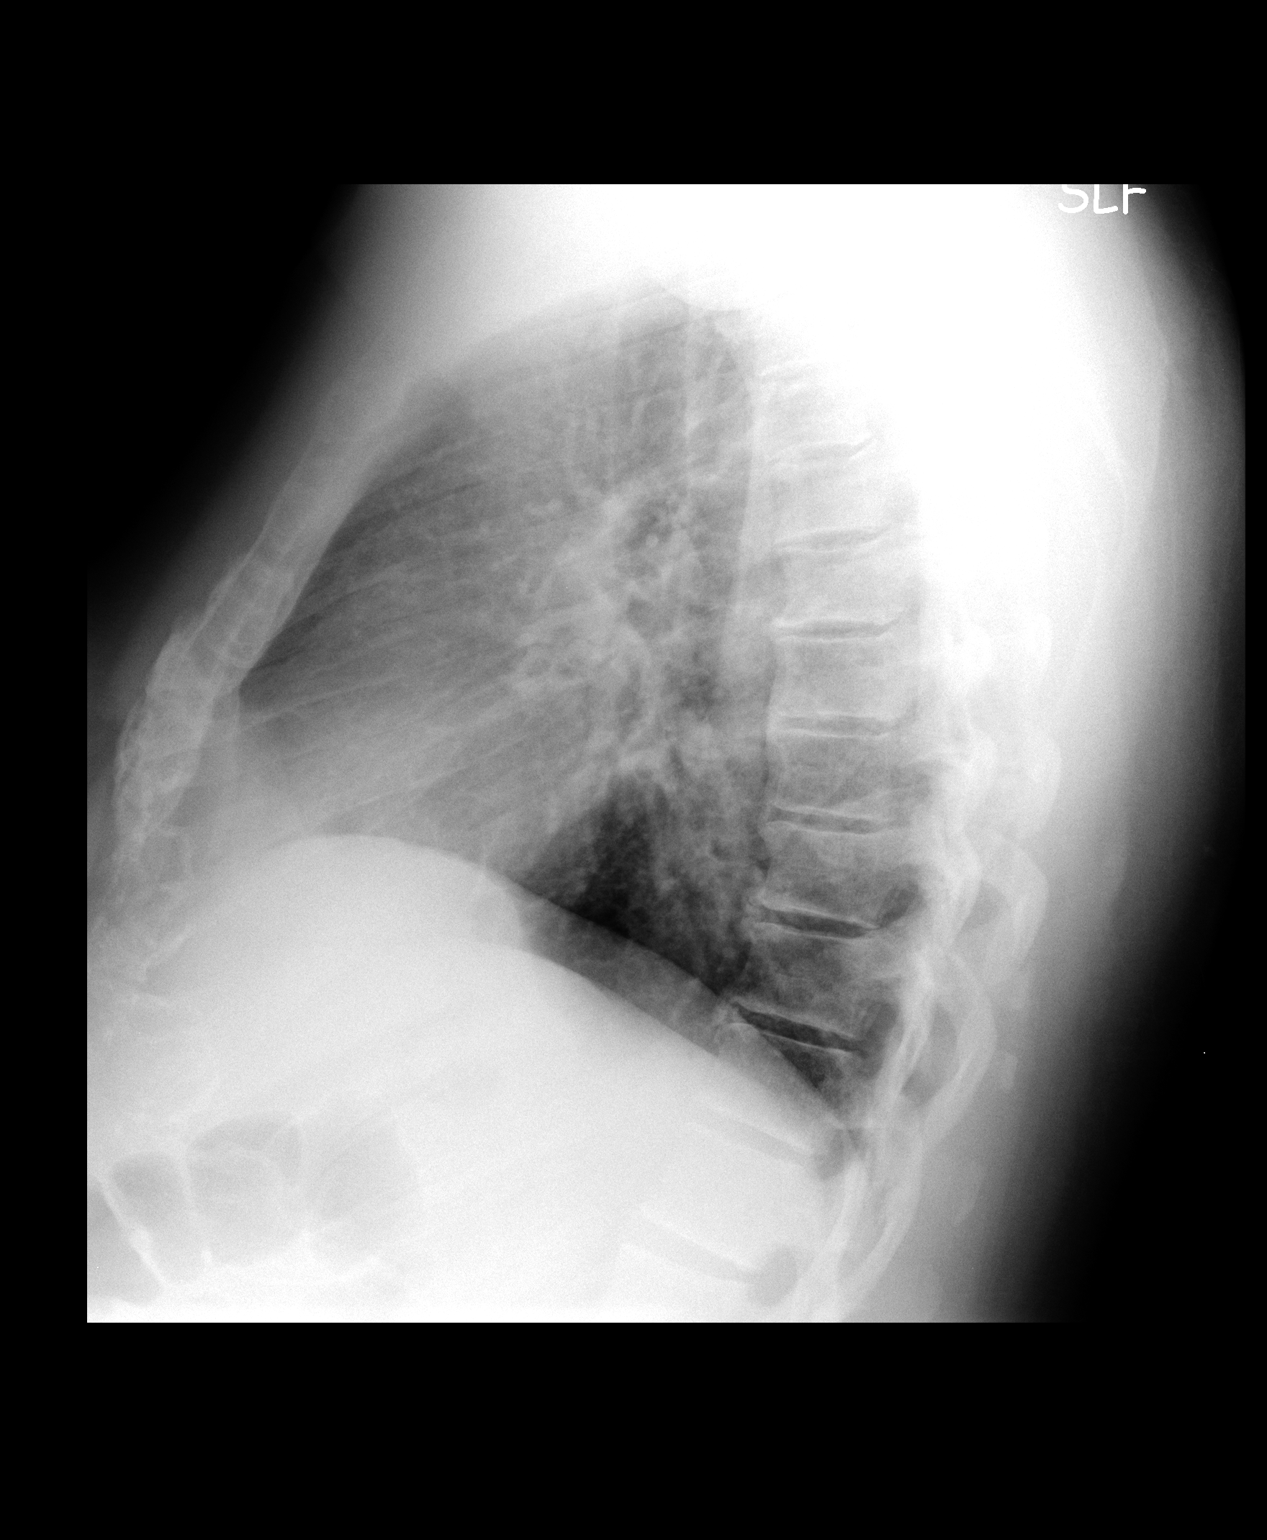

[2 of 2 positions shown; findings below may reference images not displayed]

FINDINGS: Heart is borderline in size.  Mild peribronchial
thickening.  No confluent opacities or effusions.  No acute bony
abnormality.
IMPRESSION: Mild bronchitic changes.

## 2015-11-25 ENCOUNTER — Emergency Department (HOSPITAL_COMMUNITY)
Admission: EM | Admit: 2015-11-25 | Discharge: 2015-11-25 | Disposition: A | Discharge status: Home / Self Care | Payer: Self-pay | Attending: Emergency Medicine | Admitting: Emergency Medicine

## 2015-11-25 ENCOUNTER — Encounter (HOSPITAL_COMMUNITY): Payer: Self-pay | Admitting: Emergency Medicine

## 2015-11-25 DIAGNOSIS — E119 Type 2 diabetes mellitus without complications: Secondary | ICD-10-CM | POA: Insufficient documentation

## 2015-11-25 DIAGNOSIS — I10 Essential (primary) hypertension: Secondary | ICD-10-CM | POA: Insufficient documentation

## 2015-11-25 DIAGNOSIS — R05 Cough: Secondary | ICD-10-CM | POA: Insufficient documentation

## 2015-11-25 DIAGNOSIS — R509 Fever, unspecified: Secondary | ICD-10-CM | POA: Insufficient documentation

## 2015-11-25 DIAGNOSIS — J029 Acute pharyngitis, unspecified: Secondary | ICD-10-CM | POA: Insufficient documentation

## 2015-11-25 NOTE — ED Notes (Addendum)
Pt c/o sore throat, cough, fever since Friday.  Has not vomited since Saturday.

## 2015-11-25 NOTE — ED Notes (Signed)
Patient told registration he was leaving-[place]

## 2017-07-06 LAB — BASIC METABOLIC PANEL
Glucose: 193
Glucose: 193

## 2017-07-06 LAB — LIPID PANEL
HDL: 28 — AB (ref 35–70)
Triglycerides: 207 — AB (ref 40–160)

## 2017-07-06 LAB — HEMOGLOBIN A1C: Hemoglobin A1C: 8.9

## 2018-06-29 ENCOUNTER — Encounter: Payer: Self-pay | Admitting: Internal Medicine

## 2018-07-02 ENCOUNTER — Ambulatory Visit: Payer: Self-pay | Admitting: Internal Medicine

## 2018-07-02 VITALS — BP 127/87 | HR 86 | Temp 97.3°F | Ht 70.0 in | Wt 223.6 lb

## 2018-07-02 DIAGNOSIS — E119 Type 2 diabetes mellitus without complications: Secondary | ICD-10-CM

## 2018-07-02 MED ORDER — METFORMIN HCL 1000 MG PO TABS: 1000.0000 mg | ORAL_TABLET | Freq: Two times a day (BID) | ORAL | 3 refills | Status: DC

## 2018-07-02 MED ORDER — LISINOPRIL 10 MG PO TABS: 10.0000 mg | ORAL_TABLET | Freq: Every day | ORAL | 3 refills | Status: DC

## 2018-07-02 MED ORDER — GLIMEPIRIDE 4 MG PO TABS: 4.0000 mg | ORAL_TABLET | Freq: Two times a day (BID) | ORAL | 3 refills | Status: DC

## 2018-07-02 NOTE — Progress Notes (Signed)
  Subjective:     Patient ID: Julian Riley, male   DOB: 1961/04/29, 57 y.o.   MRN: [id number]  Diabetes  He presents for his follow-up diabetic visit. He has type 2 diabetes mellitus. His disease course has been stable. There are no hypoglycemic associated symptoms. Pertinent negatives for diabetes include no blurred vision and no fatigue. He is compliant with treatment all of the time. He is following a generally healthy diet. He never participates in exercise. Eye exam is current.  Medication Refill  Pertinent negatives include no fatigue.     Review of Systems  Constitutional: Negative for fatigue.  Eyes: Negative for blurred vision.  All other systems reviewed and are negative.      Objective:   Physical Exam  Constitutional: He is oriented to person, place, and time. He appears well-developed.  HENT:  Head: Normocephalic and atraumatic.  Eyes: Pupils are equal, round, and reactive to light.  Cardiovascular: Normal rate, regular rhythm and normal heart sounds.  No murmur heard. Pulmonary/Chest: Breath sounds normal. He has no wheezes. He has no rales.  Musculoskeletal: Normal range of motion.  Neurological: He is alert and oriented to person, place, and time.  Psychiatric: He has a normal mood and affect.       Assessment:     DM2 HTN    Plan:    Order a1c, lipids Continue current medications

## 2018-07-04 ENCOUNTER — Other Ambulatory Visit: Payer: Self-pay | Admitting: Internal Medicine

## 2018-07-05 LAB — LIPID PANEL W/O CHOL/HDL RATIO
CHOLESTEROL TOTAL: 127 mg/dL (ref 100–199)
HDL: 29 mg/dL — ABNORMAL LOW (ref >39)
LDL CALC: 69 mg/dL (ref 0–99)
Triglycerides: 147 mg/dL (ref 0–149)
VLDL Cholesterol Cal: 29 mg/dL (ref 5–40)

## 2018-07-05 LAB — HGB A1C W/O EAG: HEMOGLOBIN A1C: 9.6 % — AB (ref 4.8–5.6)

## 2018-07-16 ENCOUNTER — Encounter: Payer: Self-pay | Admitting: Internal Medicine

## 2018-08-06 ENCOUNTER — Ambulatory Visit: Payer: Self-pay | Admitting: Internal Medicine

## 2018-09-03 ENCOUNTER — Ambulatory Visit: Payer: Self-pay | Admitting: Cardiovascular Disease

## 2018-09-10 ENCOUNTER — Ambulatory Visit: Payer: Self-pay | Admitting: Internal Medicine

## 2018-10-01 ENCOUNTER — Ambulatory Visit: Payer: Self-pay | Admitting: Adult Health

## 2018-10-01 ENCOUNTER — Encounter: Payer: Self-pay | Admitting: Adult Health

## 2018-10-01 VITALS — BP 118/95 | HR 83 | Temp 97.5°F | Ht 70.0 in | Wt 227.0 lb

## 2018-10-01 DIAGNOSIS — E0865 Diabetes mellitus due to underlying condition with hyperglycemia: Secondary | ICD-10-CM

## 2018-10-01 MED ORDER — LINAGLIPTIN 5 MG PO TABS: 5.0000 mg | ORAL_TABLET | Freq: Every day | ORAL | Status: DC

## 2018-10-01 NOTE — Progress Notes (Signed)
A&P  1. Diabetes mellitus due to underlying condition, uncontrolled, with hyperglycemia (HCC) Uncontrolled. Patient diet reviewed. Consuming sugary juices. Discussed diet, limit carbs, simple sugars. Instructed to begin walking for exercise. Provided glucometer given his was broken. Patient to check his blood glucose daily and log. RTC in 2 weeks for f/u. Labs ordered, basic metabolic panel. Two weeks sample of tradjenta provided and instructed to take daily with meal. Continue metformin and glimepiride  - linagliptin (TRADJENTA) tablet 5 mg    HPI: Presents to clinic for f/u labs, diabetes management  Labs: Elevated HgbA1c. 9.6%  ROS:  Denies symptoms of hypoglycemia  PE: Gen: Obese, pleasant male in NAD Cardiac: RRR, no audible murmurs Resp: Breath sounds CTA Abdomen: non tender, obese Extremities: no edema Psych/Neuro: A&O, cooperative, pleasant, no tremors

## 2018-10-01 NOTE — Patient Instructions (Signed)
See note

## 2018-10-04 ENCOUNTER — Other Ambulatory Visit: Payer: Self-pay | Admitting: Internal Medicine

## 2018-10-05 LAB — BASIC METABOLIC PANEL
BUN/Creatinine Ratio: 14 (ref 9–20)
BUN: 13 mg/dL (ref 6–24)
CALCIUM: 9.2 mg/dL (ref 8.7–10.2)
CO2: 19 mmol/L — AB (ref 20–29)
Chloride: 103 mmol/L (ref 96–106)
Creatinine, Ser: 0.9 mg/dL (ref 0.76–1.27)
GFR calc Af Amer: 110 mL/min/{1.73_m2} (ref >59)
GFR, EST NON AFRICAN AMERICAN: 95 mL/min/{1.73_m2} (ref >59)
Glucose: 142 mg/dL — ABNORMAL HIGH (ref 65–99)
POTASSIUM: 4.2 mmol/L (ref 3.5–5.2)
Sodium: 139 mmol/L (ref 134–144)

## 2018-10-06 ENCOUNTER — Encounter: Payer: Self-pay | Admitting: Cardiovascular Disease

## 2018-10-15 ENCOUNTER — Ambulatory Visit: Payer: Self-pay | Admitting: Internal Medicine

## 2018-11-05 ENCOUNTER — Ambulatory Visit: Payer: Self-pay | Admitting: Internal Medicine

## 2022-09-07 ENCOUNTER — Observation Stay (HOSPITAL_COMMUNITY)
Admission: EM | Admit: 2022-09-07 | Discharge: 2022-09-09 | Disposition: A | Discharge status: Home / Self Care | Payer: Self-pay | Attending: Internal Medicine | Admitting: Internal Medicine

## 2022-09-07 ENCOUNTER — Emergency Department (HOSPITAL_COMMUNITY): Payer: Self-pay

## 2022-09-07 ENCOUNTER — Other Ambulatory Visit: Payer: Self-pay

## 2022-09-07 DIAGNOSIS — Z7982 Long term (current) use of aspirin: Secondary | ICD-10-CM | POA: Insufficient documentation

## 2022-09-07 DIAGNOSIS — J45901 Unspecified asthma with (acute) exacerbation: Secondary | ICD-10-CM | POA: Insufficient documentation

## 2022-09-07 DIAGNOSIS — J9621 Acute and chronic respiratory failure with hypoxia: Principal | ICD-10-CM | POA: Insufficient documentation

## 2022-09-07 DIAGNOSIS — E1165 Type 2 diabetes mellitus with hyperglycemia: Secondary | ICD-10-CM | POA: Insufficient documentation

## 2022-09-07 DIAGNOSIS — E0865 Diabetes mellitus due to underlying condition with hyperglycemia: Secondary | ICD-10-CM

## 2022-09-07 DIAGNOSIS — Z1152 Encounter for screening for COVID-19: Secondary | ICD-10-CM | POA: Insufficient documentation

## 2022-09-07 DIAGNOSIS — Z79899 Other long term (current) drug therapy: Secondary | ICD-10-CM | POA: Insufficient documentation

## 2022-09-07 DIAGNOSIS — J208 Acute bronchitis due to other specified organisms: Secondary | ICD-10-CM | POA: Insufficient documentation

## 2022-09-07 DIAGNOSIS — J9601 Acute respiratory failure with hypoxia: Secondary | ICD-10-CM | POA: Diagnosis present

## 2022-09-07 DIAGNOSIS — Z7984 Long term (current) use of oral hypoglycemic drugs: Secondary | ICD-10-CM | POA: Insufficient documentation

## 2022-09-07 DIAGNOSIS — I10 Essential (primary) hypertension: Secondary | ICD-10-CM | POA: Insufficient documentation

## 2022-09-07 DIAGNOSIS — E119 Type 2 diabetes mellitus without complications: Secondary | ICD-10-CM

## 2022-09-07 LAB — CBC WITH DIFFERENTIAL/PLATELET
Abs Immature Granulocytes: 0.03 10*3/uL (ref 0.00–0.07)
Basophils Absolute: 0 10*3/uL (ref 0.0–0.1)
Basophils Relative: 0 %
Eosinophils Absolute: 0.3 10*3/uL (ref 0.0–0.5)
Eosinophils Relative: 3 %
HCT: 40.2 % (ref 39.0–52.0)
Hemoglobin: 12.9 g/dL — ABNORMAL LOW (ref 13.0–17.0)
Immature Granulocytes: 0 %
Lymphocytes Relative: 24 %
Lymphs Abs: 2.4 10*3/uL (ref 0.7–4.0)
MCH: 26.5 pg (ref 26.0–34.0)
MCHC: 32.1 g/dL (ref 30.0–36.0)
MCV: 82.7 fL (ref 80.0–100.0)
Monocytes Absolute: 0.7 10*3/uL (ref 0.1–1.0)
Monocytes Relative: 7 %
Neutro Abs: 6.6 10*3/uL (ref 1.7–7.7)
Neutrophils Relative %: 66 %
Platelets: 255 10*3/uL (ref 150–400)
RBC: 4.86 MIL/uL (ref 4.22–5.81)
RDW: 16.5 % — ABNORMAL HIGH (ref 11.5–15.5)
WBC: 10 10*3/uL (ref 4.0–10.5)
nRBC: 0 % (ref 0.0–0.2)

## 2022-09-07 LAB — BASIC METABOLIC PANEL
Anion gap: 11 (ref 5–15)
BUN: 11 mg/dL (ref 6–20)
CO2: 23 mmol/L (ref 22–32)
Calcium: 8.7 mg/dL — ABNORMAL LOW (ref 8.9–10.3)
Chloride: 100 mmol/L (ref 98–111)
Creatinine, Ser: 0.9 mg/dL (ref 0.61–1.24)
GFR, Estimated: >60 mL/min (ref >60)
Glucose, Bld: 195 mg/dL — ABNORMAL HIGH (ref 70–99)
Potassium: 3.8 mmol/L (ref 3.5–5.1)
Sodium: 134 mmol/L — ABNORMAL LOW (ref 135–145)

## 2022-09-07 LAB — RESP PANEL BY RT-PCR (RSV, FLU A&B, COVID)  RVPGX2
Influenza A by PCR: NEGATIVE
Influenza B by PCR: NEGATIVE
Resp Syncytial Virus by PCR: NEGATIVE
SARS Coronavirus 2 by RT PCR: NEGATIVE

## 2022-09-07 MED ORDER — METHYLPREDNISOLONE SODIUM SUCC 125 MG IJ SOLR
125.0000 mg | Freq: Once | INTRAMUSCULAR | Status: AC
  Administered 2022-09-08: 125 mg via INTRAVENOUS
  Filled 2022-09-07: qty 2

## 2022-09-07 MED ORDER — SODIUM CHLORIDE 0.9 % IV BOLUS
1000.0000 mL | Freq: Once | INTRAVENOUS | Status: AC
  Administered 2022-09-08: 1000 mL via INTRAVENOUS

## 2022-09-07 MED ORDER — IPRATROPIUM-ALBUTEROL 0.5-2.5 (3) MG/3ML IN SOLN
3.0000 mL | Freq: Once | RESPIRATORY_TRACT | Status: AC
  Administered 2022-09-08: 3 mL via RESPIRATORY_TRACT
  Filled 2022-09-07: qty 3

## 2022-09-07 NOTE — ED Triage Notes (Signed)
Pt arrives with reports of cough for the last two weeks. Pt reports middle back pain as well. Pt reports he feels like he did the last time he had bronchitis.

## 2022-09-07 NOTE — ED Provider Notes (Signed)
[facility] DEPT Provider Note   CSN: [id number] Arrival date & time: 09/07/22  1959     History {Add pertinent medical, surgical, social history, OB history to HPI:1} Chief Complaint  Patient presents with   Cough   Back Pain    Julian Riley is a 62 y.o. male.  HPI   Patient with medical history including diabetes, hypertension, presents with complaints of a cough x 2 weeks, states he has had a cough which is  nonproductive, he states that he feels slightly short of breath only with talking, denies any shortness of breath with exertion, he has no pleuritic chest pain, no actual chest pain, no fevers no chills no general body aches no stomach pains no nausea or vomiting.  He denies history of tobacco use, he has no pulmonary disease, he states he has had bronchitis in the past this feels similar.  No history of PEs or DVTs currently not on hormone therapy, he denies any recent surgeries, long immobilizations, denies any leg pain leg swelling he has no other complaints.   Home Medications Prior to Admission medications   Medication Sig Start Date End Date Taking? Authorizing Provider  aspirin 325 MG buffered tablet Take 325 mg by mouth daily.    [provider]  glimepiride (AMARYL) 4 MG tablet Take 1 tablet (4 mg total) by mouth 2 times daily at 12 noon and 4 pm. 07/02/18 09/30/18  Jodi Marble, MD  lisinopril (PRINIVIL,ZESTRIL) 10 MG tablet Take 1 tablet (10 mg total) by mouth daily. 07/02/18 09/30/18  Jodi Marble, MD  metFORMIN (GLUCOPHAGE) 1000 MG tablet Take 1 tablet (1,000 mg total) by mouth 2 (two) times daily with a meal. 07/02/18 09/30/18  Jodi Marble, MD  OVER THE COUNTER MEDICATION Robitussin and theraflu    [provider]      Allergies    Patient has no known allergies.    Review of Systems   Review of Systems  Constitutional:  Negative for chills and fever.  Respiratory:  Positive for cough and chest  tightness. Negative for shortness of breath.   Cardiovascular:  Negative for chest pain.  Gastrointestinal:  Negative for abdominal pain.  Neurological:  Negative for headaches.    Physical Exam Updated Vital Signs BP (!) 183/113   Pulse (!) 111   Temp 98.8 F (37.1 C) (Oral)   Resp (!) 22   Ht 5\' 10"  (1.778 m)   Wt 99.8 kg   SpO2 93%   BMI 31.57 kg/m  Physical Exam Vitals and nursing note reviewed.  Constitutional:      General: He is not in acute distress.    Appearance: He is not ill-appearing.  HENT:     Head: Normocephalic and atraumatic.     Nose: No congestion.  Eyes:     Conjunctiva/sclera: Conjunctivae normal.  Cardiovascular:     Rate and Rhythm: Regular rhythm. Tachycardia present.     Pulses: Normal pulses.     Heart sounds: No murmur heard.    No friction rub. No gallop.  Pulmonary:     Effort: No respiratory distress.     Breath sounds: Wheezing present. No rhonchi or rales.     Comments: Slightly tachypneic on my exam but able to speak in full sentences, O2 sats remained in the mid to low 90s, lung sounds are tight sounding, with expiratory wheezing present no rales or rhonchi noted. Abdominal:     Palpations: Abdomen is soft.  Tenderness: There is no abdominal tenderness. There is no right CVA tenderness or left CVA tenderness.  Musculoskeletal:     Right lower leg: No edema.     Left lower leg: No edema.     Comments: No unilateral leg swelling no calf tenderness no palpable cords.  Skin:    General: Skin is warm and dry.  Neurological:     Mental Status: He is alert.  Psychiatric:        Mood and Affect: Mood normal.     ED Results / Procedures / Treatments   Labs (all labs ordered are listed, but only abnormal results are displayed) Labs Reviewed  CBC WITH DIFFERENTIAL/PLATELET - Abnormal; Notable for the following components:      Result Value   Hemoglobin 12.9 (*)    RDW 16.5 (*)    All other components within normal limits  BASIC  METABOLIC PANEL - Abnormal; Notable for the following components:   Sodium 134 (*)    Glucose, Bld 195 (*)    Calcium 8.7 (*)    All other components within normal limits  RESP PANEL BY RT-PCR (RSV, FLU A&B, COVID)  [id number]  D-DIMER, QUANTITATIVE    EKG None  Radiology DG Chest 2 View  Result Date: 09/07/2022 CLINICAL DATA:  cough EXAM: CHEST - 2 VIEW COMPARISON:  Chest x-ray 03/16/2012 FINDINGS: The heart and mediastinal contours are unchanged. Bibasilar atelectasis. No focal consolidation. Chronic coarsened interstitial markings with no overt pulmonary edema. No pleural effusion. No pneumothorax. No acute osseous abnormality. IMPRESSION: Possible mild bronchitic changes. Electronically Signed   By: Tish Frederickson M.D.   On: 09/07/2022 21:03    Procedures Procedures  {Document cardiac monitor, telemetry assessment procedure when appropriate:1}  Medications Ordered in ED Medications  sodium chloride 0.9 % bolus 1,000 mL (has no administration in time range)  ipratropium-albuterol (DUONEB) 0.5-2.5 (3) MG/3ML nebulizer solution 3 mL (has no administration in time range)  methylPREDNISolone sodium succinate (SOLU-MEDROL) 125 mg/2 mL injection 125 mg (has no administration in time range)    ED Course/ Medical Decision Making/ A&P                           Medical Decision Making Amount and/or Complexity of Data Reviewed Labs: ordered.  Risk Prescription drug management.   This patient presents to the ED for concern of cough, this involves an extensive number of treatment options, and is a complaint that carries with it a high risk of complications and morbidity.  The differential diagnosis includes PE, ACS, pneumonia, viral bronchitis    Additional history obtained:  Additional history obtained from N/A External records from outside source obtained and reviewed including recent PCP notes   Co morbidities that complicate the patient evaluation  Diabetes  Social  Determinants of Health:  N/A    Lab Tests:  I Ordered, and personally interpreted labs.  The pertinent results include: CBC shows normocytic anemia hemoglobin 12.9, BMP shows sodium of 134, glucose 195, calcium 8.7, respiratory panel negative   Imaging Studies ordered:  I ordered imaging studies including chest x-ray I independently visualized and interpreted imaging which showed changes possibly consistent with bronchitis, I agree with the radiologist interpretation   Cardiac Monitoring:  The patient was maintained on a cardiac monitor.  I personally viewed and interpreted the cardiac monitored which showed an underlying rhythm of: ***   Medicines ordered and prescription drug management:  I ordered medication including fluids, prednisone,  bronchodilators I have reviewed the patients home medicines and have made adjustments as needed  Critical Interventions:  ***   Reevaluation:  Presents with cough x 2 weeks, triage obtain basic lab work imaging which I personally reviewed, imaging seems consistent with bronchitis, but on my examination he remains tachycardic, and O2 sats are slightly low, will provide patient with bronchodilators, steroids, some fluids, will also add on a D-dimer for possible rule out of PE.    Consultations Obtained:  I requested consultation with the ***,  and discussed lab and imaging findings as well as pertinent plan - they recommend: ***    Test Considered:  ***    Rule out ****    Dispostion and problem list  After consideration of the diagnostic results and the patients response to treatment, I feel that the patent would benefit from ***.       {Document critical care time when appropriate:1} {Document review of labs and clinical decision tools ie heart score, Chads2Vasc2 etc:1}  {Document your independent review of radiology images, and any outside records:1} {Document your discussion with family members, caretakers, and  with consultants:1} {Document social determinants of health affecting pt's care:1} {Document your decision making why or why not admission, treatments were needed:1} Final Clinical Impression(s) / ED Diagnoses Final diagnoses:  None    Rx / DC Orders ED Discharge Orders     None

## 2022-09-07 NOTE — ED Provider Triage Note (Signed)
Emergency Medicine Provider Triage Evaluation Note  KYRON SCHLITT , a 62 y.o. male  was evaluated in triage.  Pt complains of coughx 2 weeks.  Associated with posttussive emesis.  Denies any chest pain or shortness of breath.  Review of Systems  Per HPI  Physical Exam  BP (!) 183/113   Pulse (!) 111   Temp 98.8 F (37.1 C) (Oral)   Resp (!) 22   Ht 5\' 10"  (1.778 m)   Wt 99.8 kg   SpO2 93%   BMI 31.57 kg/m  Gen:   Awake, no distress   Resp:  Normal effort  MSK:   Moves extremities without difficulty  Other:  Tachycardic with regular rhythm  Medical Decision Making  Medically screening exam initiated at 8:42 PM.  Appropriate orders placed.  Johnmark A Exantus was informed that the remainder of the evaluation will be completed by another provider, this initial triage assessment does not replace that evaluation, and the importance of remaining in the ED until their evaluation is complete.     Sherrill Raring, PA-C 09/07/22 2043

## 2022-09-08 ENCOUNTER — Encounter (HOSPITAL_COMMUNITY): Payer: Self-pay

## 2022-09-08 ENCOUNTER — Emergency Department (HOSPITAL_COMMUNITY): Payer: Self-pay

## 2022-09-08 ENCOUNTER — Telehealth: Payer: Self-pay | Admitting: Student

## 2022-09-08 DIAGNOSIS — R9389 Abnormal findings on diagnostic imaging of other specified body structures: Secondary | ICD-10-CM

## 2022-09-08 DIAGNOSIS — J9601 Acute respiratory failure with hypoxia: Secondary | ICD-10-CM | POA: Diagnosis present

## 2022-09-08 DIAGNOSIS — E119 Type 2 diabetes mellitus without complications: Secondary | ICD-10-CM

## 2022-09-08 DIAGNOSIS — J208 Acute bronchitis due to other specified organisms: Secondary | ICD-10-CM

## 2022-09-08 LAB — RESPIRATORY PANEL BY PCR

## 2022-09-08 LAB — LACTIC ACID, PLASMA
Lactic Acid, Venous: 1.8 mmol/L (ref 0.5–1.9)
Lactic Acid, Venous: 1.9 mmol/L (ref 0.5–1.9)

## 2022-09-08 LAB — CBC
HCT: 39.1 % (ref 39.0–52.0)
Hemoglobin: 12.3 g/dL — ABNORMAL LOW (ref 13.0–17.0)
MCH: 26.5 pg (ref 26.0–34.0)
MCHC: 31.5 g/dL (ref 30.0–36.0)
MCV: 84.1 fL (ref 80.0–100.0)
Platelets: 236 10*3/uL (ref 150–400)
RBC: 4.65 MIL/uL (ref 4.22–5.81)
RDW: 16.7 % — ABNORMAL HIGH (ref 11.5–15.5)
WBC: 8.6 10*3/uL (ref 4.0–10.5)
nRBC: 0 % (ref 0.0–0.2)

## 2022-09-08 LAB — CBG MONITORING, ED
Glucose-Capillary: 350 mg/dL — ABNORMAL HIGH (ref 70–99)
Glucose-Capillary: 343 mg/dL — ABNORMAL HIGH (ref 70–99)

## 2022-09-08 LAB — GLUCOSE, CAPILLARY
Glucose-Capillary: 411 mg/dL — ABNORMAL HIGH (ref 70–99)
Glucose-Capillary: 369 mg/dL — ABNORMAL HIGH (ref 70–99)
Glucose-Capillary: 239 mg/dL — ABNORMAL HIGH (ref 70–99)
Glucose-Capillary: 319 mg/dL — ABNORMAL HIGH (ref 70–99)

## 2022-09-08 LAB — HEMOGLOBIN A1C
Hgb A1c MFr Bld: 8.7 % — ABNORMAL HIGH (ref 4.8–5.6)
Mean Plasma Glucose: 202.99 mg/dL

## 2022-09-08 LAB — CREATININE, SERUM
Creatinine, Ser: 0.91 mg/dL (ref 0.61–1.24)
GFR, Estimated: >60 mL/min (ref >60)

## 2022-09-08 LAB — LIPID PANEL
Cholesterol: 135 mg/dL (ref 0–200)
HDL: 30 mg/dL — ABNORMAL LOW (ref >40)
LDL Cholesterol: 94 mg/dL (ref 0–99)
Total CHOL/HDL Ratio: 4.5 RATIO
Triglycerides: 56 mg/dL (ref <150)
VLDL: 11 mg/dL (ref 0–40)

## 2022-09-08 LAB — BRAIN NATRIURETIC PEPTIDE: B Natriuretic Peptide: 74.1 pg/mL (ref 0.0–100.0)

## 2022-09-08 LAB — D-DIMER, QUANTITATIVE: D-Dimer, Quant: 0.46 ug/mL-FEU (ref 0.00–0.50)

## 2022-09-08 LAB — HIV ANTIBODY (ROUTINE TESTING W REFLEX): HIV Screen 4th Generation wRfx: NONREACTIVE

## 2022-09-08 LAB — C-REACTIVE PROTEIN: CRP: 6.1 mg/dL — ABNORMAL HIGH (ref <1.0)

## 2022-09-08 LAB — PROCALCITONIN: Procalcitonin: <0.1 ng/mL

## 2022-09-08 MED ORDER — ONDANSETRON HCL 4 MG PO TABS: 4.0000 mg | ORAL_TABLET | Freq: Four times a day (QID) | ORAL | Status: DC | PRN

## 2022-09-08 MED ORDER — INSULIN ASPART 100 UNIT/ML IJ SOLN
0.0000 [IU] | Freq: Three times a day (TID) | INTRAMUSCULAR | Status: DC
  Administered 2022-09-08: 15 [IU] via SUBCUTANEOUS
  Administered 2022-09-08: 5 [IU] via SUBCUTANEOUS
  Administered 2022-09-09 (×2): 11 [IU] via SUBCUTANEOUS
  Filled 2022-09-08: qty 0.15

## 2022-09-08 MED ORDER — ACETAMINOPHEN 325 MG PO TABS: 650.0000 mg | ORAL_TABLET | Freq: Four times a day (QID) | ORAL | Status: DC | PRN

## 2022-09-08 MED ORDER — MOMETASONE FURO-FORMOTEROL FUM 100-5 MCG/ACT IN AERO
2.0000 | INHALATION_SPRAY | Freq: Two times a day (BID) | RESPIRATORY_TRACT | Status: DC
  Administered 2022-09-08 – 2022-09-09 (×2): 2 via RESPIRATORY_TRACT
  Filled 2022-09-08: qty 8.8

## 2022-09-08 MED ORDER — SODIUM CHLORIDE 0.9 % IV SOLN
500.0000 mg | INTRAVENOUS | Status: DC
  Administered 2022-09-08 – 2022-09-09 (×2): 500 mg via INTRAVENOUS
  Filled 2022-09-08 (×2): qty 5

## 2022-09-08 MED ORDER — HYDRALAZINE HCL 20 MG/ML IJ SOLN: 10.0000 mg | Freq: Four times a day (QID) | INTRAMUSCULAR | Status: DC | PRN

## 2022-09-08 MED ORDER — ALBUTEROL SULFATE (2.5 MG/3ML) 0.083% IN NEBU
5.0000 mg | INHALATION_SOLUTION | Freq: Once | RESPIRATORY_TRACT | Status: AC
  Administered 2022-09-08: 5 mg via RESPIRATORY_TRACT
  Filled 2022-09-08: qty 6

## 2022-09-08 MED ORDER — INSULIN GLARGINE-YFGN 100 UNIT/ML ~~LOC~~ SOLN
5.0000 [IU] | Freq: Every day | SUBCUTANEOUS | Status: DC
  Administered 2022-09-08 – 2022-09-09 (×2): 5 [IU] via SUBCUTANEOUS
  Filled 2022-09-08 (×2): qty 0.05

## 2022-09-08 MED ORDER — INSULIN ASPART 100 UNIT/ML IJ SOLN
0.0000 [IU] | Freq: Every day | INTRAMUSCULAR | Status: DC
  Administered 2022-09-08: 4 [IU] via SUBCUTANEOUS
  Filled 2022-09-08: qty 0.05

## 2022-09-08 MED ORDER — SODIUM CHLORIDE 0.9 % IV BOLUS
500.0000 mL | Freq: Once | INTRAVENOUS | Status: AC
  Administered 2022-09-08: 500 mL via INTRAVENOUS

## 2022-09-08 MED ORDER — METFORMIN HCL 500 MG PO TABS
1000.0000 mg | ORAL_TABLET | Freq: Two times a day (BID) | ORAL | Status: DC
  Administered 2022-09-08 – 2022-09-09 (×3): 1000 mg via ORAL
  Filled 2022-09-08 (×3): qty 2

## 2022-09-08 MED ORDER — SODIUM CHLORIDE 0.9% FLUSH
3.0000 mL | Freq: Two times a day (BID) | INTRAVENOUS | Status: DC
  Administered 2022-09-08: 3 mL via INTRAVENOUS

## 2022-09-08 MED ORDER — ACETAMINOPHEN 650 MG RE SUPP: 650.0000 mg | Freq: Four times a day (QID) | RECTAL | Status: DC | PRN

## 2022-09-08 MED ORDER — ONDANSETRON HCL 4 MG/2ML IJ SOLN: 4.0000 mg | Freq: Four times a day (QID) | INTRAMUSCULAR | Status: DC | PRN

## 2022-09-08 MED ORDER — LISINOPRIL 20 MG PO TABS
20.0000 mg | ORAL_TABLET | Freq: Every day | ORAL | Status: DC
  Administered 2022-09-08: 20 mg via ORAL
  Filled 2022-09-08: qty 1

## 2022-09-08 MED ORDER — ENOXAPARIN SODIUM 40 MG/0.4ML IJ SOSY
40.0000 mg | PREFILLED_SYRINGE | INTRAMUSCULAR | Status: DC
  Administered 2022-09-08 – 2022-09-09 (×2): 40 mg via SUBCUTANEOUS
  Filled 2022-09-08 (×2): qty 0.4

## 2022-09-08 MED ORDER — FUROSEMIDE 10 MG/ML IJ SOLN
60.0000 mg | Freq: Once | INTRAMUSCULAR | Status: AC
  Administered 2022-09-08: 60 mg via INTRAVENOUS
  Filled 2022-09-08: qty 8

## 2022-09-08 MED ORDER — METHYLPREDNISOLONE SODIUM SUCC 40 MG IJ SOLR
40.0000 mg | Freq: Two times a day (BID) | INTRAMUSCULAR | Status: DC
  Administered 2022-09-08 – 2022-09-09 (×3): 40 mg via INTRAVENOUS
  Filled 2022-09-08 (×3): qty 1

## 2022-09-08 MED ORDER — SODIUM CHLORIDE 0.9 % IV SOLN
1.0000 g | INTRAVENOUS | Status: DC
  Administered 2022-09-08 – 2022-09-09 (×2): 1 g via INTRAVENOUS
  Filled 2022-09-08 (×2): qty 10

## 2022-09-08 MED ORDER — ALBUTEROL SULFATE (2.5 MG/3ML) 0.083% IN NEBU: 2.5000 mg | INHALATION_SOLUTION | RESPIRATORY_TRACT | Status: DC | PRN

## 2022-09-08 NOTE — Telephone Encounter (Signed)
Recall placed for both PFT and ROV as schedule is not out for 3 months. Nothing further needed.

## 2022-09-08 NOTE — H&P (Signed)
History and Physical    Patient: Julian Riley ZOX:[id number] DOB: 1961/08/13 DOA: 09/07/2022 DOS: the patient was seen and examined on 09/08/2022 PCP: Pcp, No  Patient coming from: Home  Chief Complaint:  Chief Complaint  Patient presents with   Cough   Back Pain   HPI: Julian Riley is a 62 y.o. male with medical history significant of hypertension and diabetes mellitus.  Patient reports he has been sick for 2 weeks with his respiratory symptoms that have become progressively worse.  He developed paroxysmal coughing and was having difficulty taking his medication and at times vomiting after coughing.  He presented to the ER with tachycardia and elevated BP.  Show room air pulse oximetry was 93% with notable tachypnea and increased work of breathing.  While sleeping in the ER it was noted that his O2 sats would drop to the mid 70s low 80s on room air.  Due to concerns over possible evolving sepsis physiology so was given a total of 1500 cc of fluid resuscitation.  Initial chest x-ray possible mild bronchitic changes.  CT of the chest revealed diffuse bronchial thickening consistent with bronchitis or reactive airway disease.  There is also subpleural reticulation right greater than left lung bases without honeycombing.  Concerns over possible early UIP or NSIP pattern.  No evidence of focal pneumonia.  In addition there was a 7 mm round glass nodule in the right lower lobe perihilar area with radiology recommending follow-up with a noncontrasted CT in 6 to 12 months.  Was also an incidental finding of aortic and coronary artery atherosclerosis.  PCR testing for influenza, COVID and RSV was negative.  Request has been made by the EDP for hospitalist service to admit the patient.  Review of Systems: As mentioned in the history of present illness. All other systems reviewed and are negative. Past Medical History:  Diagnosis Date   Diabetes mellitus    Hypertension    History reviewed. No  pertinent surgical history. Social History:  reports that he has never smoked. He has never used smokeless tobacco. He reports that he does not drink alcohol and does not use drugs.  No Known Allergies  Family History  Problem Relation Age of Onset   Diabetes Father     Prior to Admission medications   Medication Sig Start Date End Date Taking? Authorizing Provider  aspirin 325 MG buffered tablet Take 325 mg by mouth daily.    [provider]  glimepiride (AMARYL) 4 MG tablet Take 1 tablet (4 mg total) by mouth 2 times daily at 12 noon and 4 pm. 07/02/18 09/30/18  Jodi Marble, MD  lisinopril (PRINIVIL,ZESTRIL) 10 MG tablet Take 1 tablet (10 mg total) by mouth daily. 07/02/18 09/30/18  Jodi Marble, MD  metFORMIN (GLUCOPHAGE) 1000 MG tablet Take 1 tablet (1,000 mg total) by mouth 2 (two) times daily with a meal. 07/02/18 09/30/18  Jodi Marble, MD  OVER THE COUNTER MEDICATION Robitussin and theraflu    [provider]    Physical Exam: Vitals:   09/08/22 0006 09/08/22 0130 09/08/22 0409 09/08/22 0630  BP: (!) 161/90 (!) 155/84 (!) 145/87 (!) 154/96  Pulse: (!) 106 (!) 112 (!) 114 (!) 109  Resp: 18 (!) 27 18   Temp: 98.9 F (37.2 C)  98 F (36.7 C)   TempSrc: Oral  Oral   SpO2: 96% 94% (!) 89% 92%  Weight:      Height:       Constitutional:  NAD, calm, comfortable Eyes: PERRL, lids and conjunctivae normal ENMT: Mucous membranes are moist. Posterior pharynx clear of any exudate or lesions.Normal dentition.  Neck: normal, supple, no masses, no thyromegaly Respiratory: Crackles throughout.  More prominent in the right mid to right basilar lung fields.  No increased work of breathing at rest.  HFNC at 3 L/min with O2 sats between 92 and 94%. Cardiovascular: Regular rate and rhythm, no murmurs / rubs / gallops. No extremity edema. 2+ pedal pulses.  Abdomen: no tenderness, no masses palpated. Bowel sounds positive.  Musculoskeletal: no clubbing /  cyanosis. No joint deformity upper and lower extremities. Good ROM, no contractures. Normal muscle tone.  Skin: no rashes, lesions, ulcers. No induration Neurologic: CN 2-12 grossly intact. Sensation intact, DTR normal. Strength 5/5 x all 4 extremities.  Psychiatric: Normal judgment and insight. Alert and oriented x 3. Normal mood.    Data Reviewed:  Sodium 134, potassium 3.8, chloride 100, CO2 23, glucose 195, BUN 11, creatinine 0.9, calcium 8.7, white count 10,000 with normal differential, hemoglobin 12.9, platelets 255,000.  D-dimer normal at 0.46.  Imaging as described above in HPI  Assessment and Plan: Acute hypoxemic respiratory failure secondary to acute viral bronchitis Not on oxygen at home-continue current oxygen and wean as tolerated 2 weeks of symptoms so possible superimposed bacterial infection therefore we will begin Rocephin and Zithromax Lactic acid 1.8, procalcitonin <0.10 Budesonide nebs along with DuoNebs IV Solu-Medrol 40 mg q 12 hours.  Taper as tolerated with eventual transition to prednisone  Possible early UIP/NSIP Pulmonary medicine consulted CRP pending Systemic and inhaled steroids as above  Diabetes mellitus hyperglycemia on oral medications Continue home metformin-not receive IV contrast with CT scan CBGs elevated above 300 with utilization of steroids-continue SSI moderate dose-need to increase to high-dose if CBGs remain elevated Check hemoglobin A1c  Uncontrolled hypertension Patient presented with elevated BP in context of missed medications at home as well as respiratory distress Further exacerbated by volume repletion/fluid challenges Resume home lisinopril 20 mg daily Apresoline IV as needed  Suspected volume overload As described above and uncontrolled hypertension Give Lasix 60 mg IV x 1 Obtain echocardiogram for baseline noting slight heart enlargement on CT of the chest  Coronary calcification/ seen on CT of the chest Rubbed as  three-vessel with mild calcific plaques in the aorta and great vessels without aortic aneurysm Denies exertional or rest chest pain or dyspnea on exertion  Given the patient's underlying diabetes may need additional workup in the outpatient setting since he is currently asymptomatic Check fasting lipid panel-patient not on statin prior to admission  Possible sleep apnea Was noted to have decreased saturations while sleeping BMI 31.57 Consider outpatient PSG     Advance Care Planning:   Code Status: Full Code   VTE prophylaxis: Lovenox  Consults: Pulmonary medicine  Family Communication: Patient only  Severity of Illness: The appropriate patient status for this patient is OBSERVATION. Observation status is judged to be reasonable and necessary in order to provide the required intensity of service to ensure the patient's safety. The patient's presenting symptoms, physical exam findings, and initial radiographic and laboratory data in the context of their medical condition is felt to place them at decreased risk for further clinical deterioration. Furthermore, it is anticipated that the patient will be medically stable for discharge from the hospital within 2 midnights of admission.   Author: Junious Silk, NP 09/08/2022 6:41 AM  For on call review [email or web address].

## 2022-09-08 NOTE — ED Notes (Signed)
Patient is alert and oriented.  Skin is warm and dry.  He denies any pain.  He has ongoing saturations in the low 90's with 2l oxygen via nasal cannula.  Patient with wheezing noted with deep breathing.  Patient with no edema.

## 2022-09-08 NOTE — ED Notes (Signed)
Pt noted ot have decreased sats when assessing him to go to his assigned room. While sleeping on 5 L [place] sats 77%, woke pt and encouraged hime to take deep breaths through nose, sats increased to 92-93% then immediately went back to 88% when he stopped. Resp called Placed on hi-flow 02

## 2022-09-08 NOTE — ED Notes (Signed)
Update Leanne RN by phone on pt, he will be transported to assigned room.

## 2022-09-08 NOTE — ED Notes (Signed)
Patient is ready for transport to receiving RN

## 2022-09-08 NOTE — Telephone Encounter (Signed)
Can we schedule him for clinic visit and PFTs in 3 months with me?

## 2022-09-08 NOTE — Consult Note (Signed)
NAME:  Julian Riley, MRN:  [id number], DOB:  04-May-1961, LOS: 0 ADMISSION DATE:  09/07/2022, CONSULTATION DATE:  09/08/22 REFERRING MD:  Rennis Harding, CHIEF COMPLAINT:  cough, dyspnea   History of Present Illness:  61yM with history of HTN, DM who presented to ED 1/8 for 2 weeks of cough, posttussive emesis, dyspnea. He was placed on 3L [place] in ED. Flu/covid/RSV negative. Procal <0.1, d-dimer age corrected negative. BNP WNL. CT Chest showed bronchial wall thickening and reticulation with question of early UIP or NSIP, RLL 33mm ggo.   No prior available chest imaging.  Pertinent  Medical History  HTN DM  Significant Hospital Events: Including procedures, antibiotic start and stop dates in addition to other pertinent events   - 09/08/22 admitted, started on CAP coverage, diuresed, given 125 solumedrol IV Interim History / Subjective:    Objective   Blood pressure (!) 157/90, pulse (!) 110, temperature 98 F (36.7 C), temperature source Oral, resp. rate 17, height 5\' 10"  (1.778 m), weight 99.8 kg, SpO2 93 %.        Intake/Output Summary (Last 24 hours) at 09/08/2022 1101 Last data filed at 09/08/2022 1020 Gross per 24 hour  Intake 120 ml  Output 500 ml  Net -380 ml   Filed Weights   09/07/22 2026  Weight: 99.8 kg    Examination: General appearance: 62 y.o., male, NAD, conversant  Eyes: anicteric sclerae; PERRL, tracking appropriately HENT: NCAT; MMM Neck: Trachea midline; no lymphadenopathy, no JVD Lungs: faint inspiratory wheeze heard better over chest than neck, with normal respiratory effort CV: RRR, no murmur  Abdomen: Soft, non-tender; non-distended, BS present  Extremities: No peripheral edema, warm Skin: Normal turgor and texture; no rash Psych: Appropriate affect Neuro: Alert and oriented to person and place, no focal deficit    Resolved Hospital Problem list     Assessment & Plan:   # Acute bronchitis, possible atypical pneumonia # Hypoxia # Possible ILD Unclear if  there is underlying asthma - faint wheeze though clears with cough and bronchial wall thickening. If he has ILD, and CT not completely convincing, then nothing suggestive of underlying connective tissue disease or culprit medication. Did have covid in 2020 but nonsevere. - RVP, urine legionella - ABX per primary - reasonable to continue steroid course with taper over next 5-10 days but he'll need to watch blood sugar at home - trial bronchodilators - started dulera 2 puffs BID, needs to rinse mouth brush tongue after use. Would discharge him on this inhaler. Needs to stop it 3 weeks before his PFTs (to avoid masking features of asthma). - albuterol nebs prn here, discharge with albuterol inhaler - agree with trial of diuretic, TTE - walking oximetry prior to discharge - will plan to obtain HRCT Chest in 3 months with clinic visit, PFTs  # Nocturnal hypoxia - agree with outpatient PSG  Will sign off but glad to be reinvolved as condition changes  Best Practice (right click and "Reselect all SmartList Selections" daily)   Per primary  Labs   CBC: Recent Labs  Lab 09/07/22 2050 09/08/22 0740  WBC 10.0 8.6  NEUTROABS 6.6  --   HGB 12.9* 12.3*  HCT 40.2 39.1  MCV 82.7 84.1  PLT 255 236    Basic Metabolic Panel: Recent Labs  Lab 09/07/22 2050 09/08/22 0740  NA 134*  --   K 3.8  --   CL 100  --   CO2 23  --   GLUCOSE 195*  --  BUN 11  --   CREATININE 0.90 0.91  CALCIUM 8.7*  --    GFR: Estimated Creatinine Clearance: 102.2 mL/min (by C-G formula based on SCr of 0.91 mg/dL). Recent Labs  Lab 09/07/22 2050 09/08/22 0739 09/08/22 0740 09/08/22 0937  PROCALCITON  --   --  <0.10  --   WBC 10.0  --  8.6  --   LATICACIDVEN  --  1.8  --  1.9    Liver Function Tests: No results for input(s): "AST", "ALT", "ALKPHOS", "BILITOT", "PROT", "ALBUMIN" in the last 168 hours. No results for input(s): "LIPASE", "AMYLASE" in the last 168 hours. No results for input(s): "AMMONIA"  in the last 168 hours.  ABG No results found for: "PHART", "PCO2ART", "PO2ART", "HCO3", "TCO2", "ACIDBASEDEF", "O2SAT"   Coagulation Profile: No results for input(s): "INR", "PROTIME" in the last 168 hours.  Cardiac Enzymes: No results for input(s): "CKTOTAL", "CKMB", "CKMBINDEX", "TROPONINI" in the last 168 hours.  HbA1C: Hemoglobin A1C  Date/Time Value Ref Range Status  07/06/2017 05:30 AM 8.9  Final   Hgb A1c MFr Bld  Date/Time Value Ref Range Status  07/04/2018 09:43 AM 9.6 (H) 4.8 - 5.6 % Final    Comment:             Prediabetes: 5.7 - 6.4          Diabetes: >6.4          Glycemic control for adults with diabetes: <7.0   05/01/2010 09:58 PM 7.2 (H) <5.7 % Final    Comment:    See lab report for associated comment(s)    CBG: Recent Labs  Lab 09/08/22 0752 09/08/22 0903  GLUCAP 350* 343*    Review of Systems:   12 point review of systems negative except as in HPI  Past Medical History:  He,  has a past medical history of Diabetes mellitus and Hypertension.   Surgical History:  History reviewed. No pertinent surgical history.   Social History:   reports that he has never smoked. He has never used smokeless tobacco. He reports that he does not drink alcohol and does not use drugs.   Family History:  His family history includes Diabetes in his father.   Allergies No Known Allergies   Home Medications  Prior to Admission medications   Medication Sig Start Date End Date Taking? Authorizing Provider  aspirin 325 MG buffered tablet Take 325 mg by mouth daily.   Yes [provider]  diphenhydrAMINE (BENADRYL) 25 mg capsule Take 25 mg by mouth daily as needed for allergies.   Yes [provider]  glimepiride (AMARYL) 4 MG tablet Take 1 tablet (4 mg total) by mouth 2 times daily at 12 noon and 4 pm. 07/02/18 09/08/22 Yes Tejan-Sie, Brandt Loosen, MD  guaiFENesin (MUCINEX) 600 MG 12 hr tablet Take 600 mg by mouth 2 (two) times daily.   Yes [provider]  guaifenesin (ROBITUSSIN) 100 MG/5ML syrup Take 200 mg by mouth 3 (three) times daily as needed for cough or congestion.   Yes [provider]  lisinopril (ZESTRIL) 30 MG tablet Take 30 mg by mouth daily.   Yes [provider]  metFORMIN (GLUCOPHAGE) 1000 MG tablet Take 1 tablet (1,000 mg total) by mouth 2 (two) times daily with a meal. 07/02/18 09/08/22 Yes Tejan-Sie, Brandt Loosen, MD  Multiple Vitamins-Minerals (MULTI FOR HIM 50+) TABS Take 1 tablet by mouth daily.   Yes [provider]  pioglitazone (ACTOS) 45 MG tablet Take 45  mg by mouth daily. 09/08/21  Yes [provider]     Critical care time: na

## 2022-09-08 NOTE — Hospital Course (Addendum)
62 year old man with past medical history of diabetes mellitus, no previous lung disease, presented to [facility] emergency department with 2-week history of cough, wheezing, difficulty speaking in sentences.    Upon evaluation in the emergency department patient was found to be hypoxic.  CT imaging of the chest revealed diffuse bronchial thickening consistent with bronchitis or reactive airway disease.  Hospitalist group was then called to assess the patient for admission to the hospital for evaluation and management of acute hypoxic respiratory failure.  Due to initial radiographic concerns for interstitial lung disease, Dr. Verlee Monte with pulmonology was consulted.  They felt the interstitial lung disease was unlikely.  However, they recommended the initiation of a Dulera inhaler in addition to providing the patient with a albuterol MDI at time of discharge.  They also felt that a course of antibiotics and steroids was appropriate throughout the hospitalization and at time of discharge.    Of note, patient did exhibit substantial hyperglycemia throughout the hospitalization thought to be secondary to steroid use in the setting of patient's known history of diabetes mellitus.  Patient reported already being on 4 oral hypoglycemic agents prior to hospitalization.  Patient was advised that it would be optimal that he be transition to a insulin based regimen however he declined.  Patient was advised to follow-up closely with his outpatient provider for further management of his diabetes and potential transition to an insulin based regimen in the near future.  Patient clinically improved with decreasing oxygen requirement, improving shortness of breath, wheezing and an increasing ability to ambulate without exertion.  Arrangements were made for the patient to be discharged home.  Patient was advised to follow-up with his primary care provider as well as [facility] in the outpatient setting.   Patient was discharged home in improved and stable condition.

## 2022-09-08 NOTE — ED Notes (Signed)
Pt dropped down to the mid 70s to low 80s on RA

## 2022-09-09 ENCOUNTER — Other Ambulatory Visit (HOSPITAL_COMMUNITY): Payer: Self-pay

## 2022-09-09 ENCOUNTER — Observation Stay (HOSPITAL_BASED_OUTPATIENT_CLINIC_OR_DEPARTMENT_OTHER): Payer: Self-pay

## 2022-09-09 DIAGNOSIS — E1165 Type 2 diabetes mellitus with hyperglycemia: Secondary | ICD-10-CM

## 2022-09-09 DIAGNOSIS — R0603 Acute respiratory distress: Secondary | ICD-10-CM

## 2022-09-09 DIAGNOSIS — J45901 Unspecified asthma with (acute) exacerbation: Secondary | ICD-10-CM

## 2022-09-09 LAB — CBC
HCT: 39.2 % (ref 39.0–52.0)
Hemoglobin: 12.5 g/dL — ABNORMAL LOW (ref 13.0–17.0)
MCH: 26.7 pg (ref 26.0–34.0)
MCHC: 31.9 g/dL (ref 30.0–36.0)
MCV: 83.6 fL (ref 80.0–100.0)
Platelets: 273 10*3/uL (ref 150–400)
RBC: 4.69 MIL/uL (ref 4.22–5.81)
RDW: 16.7 % — ABNORMAL HIGH (ref 11.5–15.5)
WBC: 13.2 10*3/uL — ABNORMAL HIGH (ref 4.0–10.5)
nRBC: 0 % (ref 0.0–0.2)

## 2022-09-09 LAB — ECHOCARDIOGRAM COMPLETE
Area-P 1/2: 4.04 cm2
Height: 70 in
S' Lateral: 2.3 cm
Weight: 3520 oz

## 2022-09-09 LAB — GLUCOSE, CAPILLARY
Glucose-Capillary: 335 mg/dL — ABNORMAL HIGH (ref 70–99)
Glucose-Capillary: 330 mg/dL — ABNORMAL HIGH (ref 70–99)

## 2022-09-09 LAB — BASIC METABOLIC PANEL
Anion gap: 9 (ref 5–15)
BUN: 23 mg/dL — ABNORMAL HIGH (ref 6–20)
CO2: 24 mmol/L (ref 22–32)
Calcium: 8.6 mg/dL — ABNORMAL LOW (ref 8.9–10.3)
Chloride: 102 mmol/L (ref 98–111)
Creatinine, Ser: 0.88 mg/dL (ref 0.61–1.24)
GFR, Estimated: >60 mL/min (ref >60)
Glucose, Bld: 348 mg/dL — ABNORMAL HIGH (ref 70–99)
Potassium: 4.8 mmol/L (ref 3.5–5.1)
Sodium: 135 mmol/L (ref 135–145)

## 2022-09-09 MED ORDER — PIOGLITAZONE HCL 30 MG PO TABS
45.0000 mg | ORAL_TABLET | Freq: Every day | ORAL | Status: DC
  Administered 2022-09-09: 45 mg via ORAL
  Filled 2022-09-09: qty 1

## 2022-09-09 MED ORDER — GLIMEPIRIDE 4 MG PO TABS
4.0000 mg | ORAL_TABLET | Freq: Two times a day (BID) | ORAL | Status: DC
  Administered 2022-09-09: 4 mg via ORAL
  Filled 2022-09-09 (×2): qty 1

## 2022-09-09 MED ORDER — PREDNISONE 20 MG PO TABS
40.0000 mg | ORAL_TABLET | Freq: Every day | ORAL | 0 refills | Status: AC
  Filled 2022-09-09: qty 8, 4d supply, fill #0

## 2022-09-09 MED ORDER — AZITHROMYCIN 500 MG PO TABS
500.0000 mg | ORAL_TABLET | Freq: Every day | ORAL | 0 refills | Status: AC
  Filled 2022-09-09: qty 3, 3d supply, fill #0

## 2022-09-09 MED ORDER — PERFLUTREN LIPID MICROSPHERE
1.0000 mL | INTRAVENOUS | Status: AC | PRN
  Administered 2022-09-09: 2 mL via INTRAVENOUS

## 2022-09-09 MED ORDER — GLIMEPIRIDE 4 MG PO TABS
4.0000 mg | ORAL_TABLET | Freq: Two times a day (BID) | ORAL | 2 refills | Status: AC
  Filled 2022-09-09: qty 60, 30d supply, fill #0

## 2022-09-09 MED ORDER — METFORMIN HCL 1000 MG PO TABS
1000.0000 mg | ORAL_TABLET | Freq: Two times a day (BID) | ORAL | 2 refills | Status: AC
  Filled 2022-09-09: qty 60, 30d supply, fill #0

## 2022-09-09 MED ORDER — ALBUTEROL SULFATE HFA 108 (90 BASE) MCG/ACT IN AERS
2.0000 | INHALATION_SPRAY | Freq: Four times a day (QID) | RESPIRATORY_TRACT | 2 refills | Status: AC | PRN
  Filled 2022-09-09: qty 6.7, 25d supply, fill #0

## 2022-09-09 MED ORDER — CEFDINIR 300 MG PO CAPS
300.0000 mg | ORAL_CAPSULE | Freq: Two times a day (BID) | ORAL | 0 refills | Status: AC
  Filled 2022-09-09: qty 6, 3d supply, fill #0

## 2022-09-09 MED ORDER — LINAGLIPTIN 5 MG PO TABS
5.0000 mg | ORAL_TABLET | Freq: Every day | ORAL | 2 refills | Status: AC
  Filled 2022-09-09: qty 30, 30d supply, fill #0

## 2022-09-09 MED ORDER — PIOGLITAZONE HCL 45 MG PO TABS
45.0000 mg | ORAL_TABLET | Freq: Every day | ORAL | 2 refills | Status: AC
  Filled 2022-09-09: qty 30, 30d supply, fill #0

## 2022-09-09 MED ORDER — MOMETASONE FURO-FORMOTEROL FUM 100-5 MCG/ACT IN AERO
2.0000 | INHALATION_SPRAY | Freq: Two times a day (BID) | RESPIRATORY_TRACT | 2 refills | Status: AC
  Filled 2022-09-09: qty 13, 30d supply, fill #0

## 2022-09-09 MED ORDER — METFORMIN HCL 500 MG PO TABS: 1000.0000 mg | ORAL_TABLET | Freq: Two times a day (BID) | ORAL | Status: DC

## 2022-09-09 MED ORDER — ONDANSETRON HCL 4 MG PO TABS
4.0000 mg | ORAL_TABLET | Freq: Four times a day (QID) | ORAL | 0 refills | Status: AC | PRN
  Filled 2022-09-09: qty 20, 5d supply, fill #0

## 2022-09-09 MED ORDER — INSULIN GLARGINE-YFGN 100 UNIT/ML ~~LOC~~ SOLN
5.0000 [IU] | Freq: Every day | SUBCUTANEOUS | 11 refills | Status: DC
  Filled 2022-09-09: qty 10, 90d supply, fill #0

## 2022-09-09 MED ORDER — LISINOPRIL 20 MG PO TABS
30.0000 mg | ORAL_TABLET | Freq: Every day | ORAL | Status: DC
  Administered 2022-09-09: 30 mg via ORAL
  Filled 2022-09-09: qty 1

## 2022-09-09 NOTE — TOC Transition Note (Signed)
Transition of Care [facility]) - CM/SW Discharge Note  Patient Details  Name: Julian Riley MRN: [id number] Date of Birth: May 17, 1961  Transition of Care [facility]) CM/SW Contact:  Sherie Don, LCSW Phone Number: 09/09/2022, 2:50 PM  Clinical Narrative: Patient is uninsured and unable to pay for medications. [facility] voucher provided to patient and it was confirmed he can pay the $3.00 copay per medication. TOC signing off.  Final next level of care: Home/Self Care Barriers to Discharge: No Barriers Identified  Patient Goals and CMS Choice Choice offered to / list presented to : NA  Discharge Plan and Services Additional resources added to the After Visit Summary for          DME Arranged: N/A DME Agency: NA  Social Determinants of Health (SDOH) Interventions SDOH Screenings   Tobacco Use: Low Risk  (09/08/2022)   Readmission Risk Interventions     No data to display

## 2022-09-09 NOTE — TOC CM/SW Note (Signed)
Transition of Care (TOC) Screening Note  Patient Details  Name: Julian Riley Date of Birth: [birth year]  Transition of Care [facility]) CM/SW Contact:    Sherie Don, LCSW Phone Number: 09/09/2022, 2:00 PM  Transition of Care Department [facility]) has reviewed patient and no TOC needs have been identified at this time. We will continue to monitor patient advancement through interdisciplinary progression rounds. If new patient transition needs arise, please place a TOC consult.

## 2022-09-09 NOTE — Discharge Summary (Addendum)
Physician Discharge Summary   Patient: Julian Riley MRN: [id number] DOB: 1960-09-20  Admit date:     09/07/2022  Discharge date: 09/09/22  Discharge Physician: Vernelle Emerald   PCP: Pcp, No   Recommendations at discharge:   Patient advised to take all medications as instructed including his course of steroids and antibiotic therapy Patient additionally placed on Dulera maintenance inhaler with as needed albuterol MDI inhaler and has been advised to take these medications as instructed A referral has been placed for the patient to follow-up with Dr. Verlee Monte with [facility] at time of discharge.  Patient has been advised to maintain this appointment Patient has additionally been advised to follow-up with his primary care provider Patient has been advised to return the emergency department if he develops worsening shortness of breath fevers or chest pain  Discharge Diagnoses: Principal Problem:   Acute respiratory failure with hypoxia ([place]) Active Problems:   Reactive airway disease with wheezing with acute exacerbation   Type 2 diabetes mellitus with hyperglycemia, without long-term current use of insulin ([place])  Resolved Problems:   * No resolved hospital problems. *   Hospital Course: 62 year old man with past medical history of diabetes mellitus, no previous lung disease, presented to [facility] emergency department with 2-week history of cough, wheezing, difficulty speaking in sentences.    Upon evaluation in the emergency department patient was found to be hypoxic.  CT imaging of the chest revealed diffuse bronchial thickening consistent with bronchitis or reactive airway disease.  Hospitalist group was then called to assess the patient for admission to the hospital for evaluation and management of acute hypoxic respiratory failure.  Due to initial radiographic concerns for interstitial lung disease, Dr. Verlee Monte with pulmonology was consulted.  They felt the  interstitial lung disease was unlikely.  However, they recommended the initiation of a Dulera inhaler in addition to providing the patient with a albuterol MDI at time of discharge.  They also felt that a course of antibiotics and steroids was appropriate throughout the hospitalization and at time of discharge.    Of note, patient did exhibit substantial hyperglycemia throughout the hospitalization thought to be secondary to steroid use in the setting of patient's known history of diabetes mellitus.  Patient reported already being on 4 oral hypoglycemic agents prior to hospitalization.  Patient was advised that it would be optimal that he be transition to a insulin based regimen however he declined.  Patient was advised to follow-up closely with his outpatient provider for further management of his diabetes and potential transition to an insulin based regimen in the near future.  Patient clinically improved with decreasing oxygen requirement, improving shortness of breath, wheezing and an increasing ability to ambulate without exertion.  Arrangements were made for the patient to be discharged home.  Patient was advised to follow-up with his primary care provider as well as [facility] in the outpatient setting.  Patient was discharged home in improved and stable condition.    Consultants: Dr. Verlee Monte with Pulmonology Procedures performed: none  Disposition: Home Diet recommendation:  Carb modified diet  DISCHARGE MEDICATION: Allergies as of 09/09/2022   No Known Allergies      Medication List     TAKE these medications    albuterol 108 (90 Base) MCG/ACT inhaler Commonly known as: VENTOLIN HFA Inhale 2 puffs into the lungs every 6 (six) hours as needed for wheezing or shortness of breath.   aspirin 325 MG buffered tablet Take 325 mg by mouth  daily.   azithromycin 500 MG tablet Commonly known as: Zithromax Take 1 tablet (500 mg total) by mouth daily for 3 days Start taking on:  September 10, 2022   cefdinir 300 MG capsule Commonly known as: OMNICEF Take 1 capsule (300 mg total) by mouth 2 (two) times daily for 3 days. Start taking on: September 10, 2022   diphenhydrAMINE 25 mg capsule Commonly known as: BENADRYL Take 25 mg by mouth daily as needed for allergies.   glimepiride 4 MG tablet Commonly known as: AMARYL Take 1 tablet (4 mg total) by mouth 2 times daily at 12 noon and 4 pm.   guaifenesin 100 MG/5ML syrup Commonly known as: ROBITUSSIN Take 200 mg by mouth 3 (three) times daily as needed for cough or congestion.   guaiFENesin 600 MG 12 hr tablet Commonly known as: MUCINEX Take 600 mg by mouth 2 (two) times daily.   linagliptin 5 MG Tabs tablet Commonly known as: Tradjenta Take 1 tablet (5 mg total) by mouth daily.   lisinopril 30 MG tablet Commonly known as: ZESTRIL Take 30 mg by mouth daily.   metFORMIN 1000 MG tablet Commonly known as: GLUCOPHAGE Take 1 tablet (1,000 mg total) by mouth 2 (two) times daily with a meal.   mometasone-formoterol 100-5 MCG/ACT Aero Commonly known as: DULERA Inhale 2 puffs into the lungs 2 (two) times daily.   Multi For Him 50+ Tabs Take 1 tablet by mouth daily.   ondansetron 4 MG tablet Commonly known as: ZOFRAN Take 1 tablet (4 mg total) by mouth every 6 (six) hours as needed for nausea.   pioglitazone 45 MG tablet Commonly known as: ACTOS Take 1 tablet (45 mg total) by mouth daily.   predniSONE 20 MG tablet Commonly known as: DELTASONE Take 2 tablets (40 mg total) by mouth daily for 4 doses. Start taking on: September 10, 2022        Follow-up Information     Maryjane Hurter, MD. Go to.   Specialty: Pulmonary Disease Why: You will be contacted with a follow-up appointment in the next several days.  If you do not see a phone call, please call the number listed here. Contact information: [address] [place] [address] [phone]         Your Primary care Provider Follow up in  1 week(s).                  Discharge Exam: Filed Weights   09/07/22 2026  Weight: 99.8 kg    Constitutional: Awake alert and oriented x3, no associated distress.   Respiratory: clear to auscultation bilaterally, no wheezing, no crackles. Normal respiratory effort. No accessory muscle use.  Cardiovascular: Regular rate and rhythm, no murmurs / rubs / gallops. No extremity edema. 2+ pedal pulses. No carotid bruits.  Abdomen: Abdomen is soft and nontender.  No evidence of intra-abdominal masses.  Positive bowel sounds noted in all quadrants.   Musculoskeletal: No joint deformity upper and lower extremities. Good ROM, no contractures. Normal muscle tone.     Condition at discharge: fair  The results of significant diagnostics from this hospitalization (including imaging, microbiology, ancillary and laboratory) are listed below for reference.   Imaging Studies: ECHOCARDIOGRAM COMPLETE  Result Date: 09/09/2022    ECHOCARDIOGRAM REPORT   Patient Name:   FERD HORRIGAN Date of Exam: 09/09/2022 Medical Rec #:  [id number]      Height:       70.0 in Accession #:  [id number]     Weight:       220.0 lb Date of Birth:  04/12/1961      BSA:          2.174 m Patient Age:    61 years       BP:           137/73 mmHg Patient Gender: M              HR:           95 bpm. Exam Location:  Inpatient Procedure: 2D Echo, Color Doppler, Cardiac Doppler and Intracardiac            Opacification Agent Indications:    Acute Respiratory Distress R06.03  History:        Patient has no prior history of Echocardiogram examinations.                 Risk Factors:Hypertension, Diabetes and Dyslipidemia.  Sonographer:    Irving BurtonEmily Senior RDCS Referring Phys: [id number] ALLISON L ELLIS  Sonographer Comments: Technically difficult study due to poor echo windows IMPRESSIONS  1. Left ventricular ejection fraction, by estimation, is 65 to 70%. The left ventricle has normal function. The left ventricle has no regional wall motion  abnormalities. There is mild left ventricular hypertrophy. Left ventricular diastolic parameters are indeterminate.  2. Right ventricular systolic function is mildly reduced. The right ventricular size is normal. Tricuspid regurgitation signal is inadequate for assessing PA pressure.  3. The mitral valve is grossly normal. No evidence of mitral valve regurgitation. No evidence of mitral stenosis.  4. The aortic valve is grossly normal. Aortic valve regurgitation is not visualized. No aortic stenosis is present.  5. The inferior vena cava is normal in size with <50% respiratory variability, suggesting right atrial pressure of 8 mmHg. FINDINGS  Left Ventricle: Left ventricular ejection fraction, by estimation, is 65 to 70%. The left ventricle has normal function. The left ventricle has no regional wall motion abnormalities. Definity contrast agent was given IV to delineate the left ventricular  endocardial borders. The left ventricular internal cavity size was normal in size. There is mild left ventricular hypertrophy. Left ventricular diastolic parameters are indeterminate. Right Ventricle: The right ventricular size is normal. Right vetricular wall thickness was not well visualized. Right ventricular systolic function is mildly reduced. Tricuspid regurgitation signal is inadequate for assessing PA pressure. Left Atrium: Left atrial size was normal in size. Right Atrium: Right atrial size was normal in size. Pericardium: Trivial pericardial effusion is present. Mitral Valve: The mitral valve is grossly normal. Mild mitral annular calcification. No evidence of mitral valve regurgitation. No evidence of mitral valve stenosis. Tricuspid Valve: The tricuspid valve is normal in structure. Tricuspid valve regurgitation is not demonstrated. No evidence of tricuspid stenosis. Aortic Valve: The aortic valve is grossly normal. Aortic valve regurgitation is not visualized. No aortic stenosis is present. Pulmonic Valve: The  pulmonic valve was not well visualized. Pulmonic valve regurgitation is not visualized. No evidence of pulmonic stenosis. Aorta: The aortic root is normal in size and structure. Venous: The inferior vena cava is normal in size with less than 50% respiratory variability, suggesting right atrial pressure of 8 mmHg. IAS/Shunts: The interatrial septum was not well visualized.  LEFT VENTRICLE PLAX 2D LVIDd:         3.50 cm   Diastology LVIDs:         2.30 cm   LV e' medial:    9.14 cm/s LV PW:  1.00 cm   LV E/e' medial:  10.8 LV IVS:        1.20 cm   LV e' lateral:   10.80 cm/s LVOT diam:     2.00 cm   LV E/e' lateral: 9.2 LV SV:         69 LV SV Index:   32 LVOT Area:     3.14 cm  RIGHT VENTRICLE RV S prime:     12.40 cm/s TAPSE (M-mode): 2.1 cm LEFT ATRIUM             Index        RIGHT ATRIUM           Index LA diam:        3.50 cm 1.61 cm/m   RA Area:     12.30 cm LA Vol (A2C):   50.8 ml 23.37 ml/m  RA Volume:   25.90 ml  11.92 ml/m LA Vol (A4C):   49.8 ml 22.91 ml/m LA Biplane Vol: 51.0 ml 23.46 ml/m  AORTIC VALVE LVOT Vmax:   108.00 cm/s LVOT Vmean:  79.200 cm/s LVOT VTI:    0.219 m  AORTA Ao Root diam: 3.40 cm Ao Asc diam:  3.40 cm MITRAL VALVE MV Area (PHT): 4.04 cm     SHUNTS MV Decel Time: 188 msec     Systemic VTI:  0.22 m MV E velocity: 98.90 cm/s   Systemic Diam: 2.00 cm MV A velocity: 119.00 cm/s MV E/A ratio:  0.83 Cherlynn Kaiser MD Electronically signed by Cherlynn Kaiser MD Signature Date/Time: 09/09/2022/8:38:20 AM    Final    CT Chest Wo Contrast  Result Date: 09/08/2022 CLINICAL DATA:  Nontraumatic chest wall pain, infection or inflammation suspected. Cough for 2 weeks. EXAM: CT CHEST WITHOUT CONTRAST TECHNIQUE: Multidetector CT imaging of the chest was performed following the standard protocol without IV contrast. RADIATION DOSE REDUCTION: This exam was performed according to the departmental dose-optimization program which includes automated exposure control, adjustment of the mA  and/or kV according to patient size and/or use of iterative reconstruction technique. COMPARISON:  PA and lateral chest yesterday, PA Lat 03/16/2012. FINDINGS: Cardiovascular: The heart is slightly enlarged. There is no significant pericardial effusion. There are three-vessel coronary artery calcifications, mild calcific plaques in the aorta and great vessels, with no aortic aneurysm. The pulmonary arteries and veins are normal in caliber. Mediastinum/Nodes: No enlarged mediastinal or axillary lymph nodes. Thyroid gland, trachea, and esophagus demonstrate no significant findings. Small hiatal hernia is also noted. Lungs/Pleura: There is diffuse bronchial thickening. The main bronchi are clear. No bronchial impactions are seen. There is subpleural reticulation in the right-greater-than-left lung bases without appreciable honeycombing. There are few linear scar-like opacities in the bases. There is a 7 mm ground-glass nodule in the right lower lobe perihilar area on 3:82. There are no further visible nodules. No confluent area of airspace consolidation is seen. There is no pleural effusion, thickening or pneumothorax. Upper Abdomen: The liver is mildly enlarged, moderately steatotic. No acute upper abdominal findings. Musculoskeletal: There is bilateral dendritic gynecomastia. No other focal chest wall abnormality. No focal inflammatory changes or mass. 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 The visualized ribcage is intact. IMPRESSION: 1. Diffuse bronchial thickening consistent with bronchitis or reactive airways disease. 2. Subpleural reticulation in the right-greater-than-left lung bases without appreciable honeycombing. Findings could be due to early UIP or NSIP pattern. Is evident. No focal pneumonia 3. 7 mm ground-glass nodule in the  right lower lobe perihilar area. Per Fleischner Society Guidelines, recommend a non-contrast Chest CT at 6-12 months to confirm persistence, then  additional non-contrast Chest CTs every 2 years until 5 years. If nodule grows or develops solid component(s), consider resection. These guidelines do not apply to immunocompromised patients and patients with cancer. Follow up in patients with significant comorbidities as clinically warranted. For lung cancer screening, adhere to Lung-RADS guidelines. Reference: Radiology. 2017; 284(1):228-43. 4. Aortic and coronary artery atherosclerosis. 5. Small hiatal hernia. 6. Bilateral dendritic gynecomastia. No traumatic or inflammatory changes in the chest wall. 7. Mild hepatomegaly with moderate steatosis. 8. 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 Electronically Signed   By: Telford Nab M.D.   On: 09/08/2022 02:52   DG Chest 2 View  Result Date: 09/07/2022 CLINICAL DATA:  cough EXAM: CHEST - 2 VIEW COMPARISON:  Chest x-ray 03/16/2012 FINDINGS: The heart and mediastinal contours are unchanged. Bibasilar atelectasis. No focal consolidation. Chronic coarsened interstitial markings with no overt pulmonary edema. No pleural effusion. No pneumothorax. No acute osseous abnormality. IMPRESSION: Possible mild bronchitic changes. Electronically Signed   By: Iven Finn M.D.   On: 09/07/2022 21:03    Microbiology: Results for orders placed or performed during the hospital encounter of 09/07/22  Resp panel by RT-PCR (RSV, Flu A&B, Covid) Anterior Nasal Swab     Status: None   Collection Time: 09/07/22  8:50 PM   Specimen: Anterior Nasal Swab  Result Value Ref Range Status   SARS Coronavirus 2 by RT PCR NEGATIVE NEGATIVE Final    Comment: (NOTE) SARS-CoV-2 target nucleic acids are NOT DETECTED.  The SARS-CoV-2 RNA is generally detectable in upper respiratory specimens during the acute phase of infection. The lowest concentration of SARS-CoV-2 viral copies this assay can detect is 138 copies/mL. A negative result does not preclude SARS-Cov-2 infection and should not be used as the  sole basis for treatment or other patient management decisions. A negative result may occur with  improper specimen collection/handling, submission of specimen other than nasopharyngeal swab, presence of viral mutation(s) within the areas targeted by this assay, and inadequate number of viral copies(<138 copies/mL). A negative result must be combined with clinical observations, patient history, and epidemiological information. The expected result is Negative.  Fact Sheet for Patients:  EntrepreneurPulse.com.au  Fact Sheet for Healthcare Providers:  IncredibleEmployment.be  This test is no t yet approved or cleared by the [place] FDA and  has been authorized for detection and/or diagnosis of SARS-CoV-2 by FDA under an Emergency Use Authorization (EUA). This EUA will remain  in effect (meaning this test can be used) for the duration of the COVID-19 declaration under Section 564(b)(1) of the Act, 21 U.S.C.section 360bbb-3(b)(1), unless the authorization is terminated  or revoked sooner.       Influenza A by PCR NEGATIVE NEGATIVE Final   Influenza B by PCR NEGATIVE NEGATIVE Final    Comment: (NOTE) The Xpert Xpress SARS-CoV-2/FLU/RSV plus assay is intended as an aid in the diagnosis of influenza from Nasopharyngeal swab specimens and should not be used as a sole basis for treatment. Nasal washings and aspirates are unacceptable for Xpert Xpress SARS-CoV-2/FLU/RSV testing.  Fact Sheet for Patients: EntrepreneurPulse.com.au  Fact Sheet for Healthcare Providers: IncredibleEmployment.be  This test is not yet approved or cleared by the [place] FDA and has been authorized for detection and/or diagnosis of SARS-CoV-2 by FDA under an Emergency Use Authorization (EUA). This EUA will remain in effect (meaning this test  can be used) for the duration of the COVID-19 declaration under Section 564(b)(1) of the  Act, 21 U.S.C. section 360bbb-3(b)(1), unless the authorization is terminated or revoked.     Resp Syncytial Virus by PCR NEGATIVE NEGATIVE Final    Comment: (NOTE) Fact Sheet for Patients: BloggerCourse.com  Fact Sheet for Healthcare Providers: SeriousBroker.it  This test is not yet approved or cleared by the [place] FDA and has been authorized for detection and/or diagnosis of SARS-CoV-2 by FDA under an Emergency Use Authorization (EUA). This EUA will remain in effect (meaning this test can be used) for the duration of the COVID-19 declaration under Section 564(b)(1) of the Act, 21 U.S.C. section 360bbb-3(b)(1), unless the authorization is terminated or revoked.  Performed at [facility], [address], [place], [place] [address]   Respiratory (~20 pathogens) panel by PCR     Status: None   Collection Time: 09/08/22 11:38 AM   Specimen: Nasopharyngeal Swab; Respiratory  Result Value Ref Range Status   Adenovirus NOT DETECTED NOT DETECTED Final   Coronavirus 229E NOT DETECTED NOT DETECTED Final    Comment: (NOTE) The Coronavirus on the Respiratory Panel, DOES NOT test for the novel  Coronavirus (2019 nCoV)    Coronavirus HKU1 NOT DETECTED NOT DETECTED Final   Coronavirus NL63 NOT DETECTED NOT DETECTED Final   Coronavirus OC43 NOT DETECTED NOT DETECTED Final   Metapneumovirus NOT DETECTED NOT DETECTED Final   Rhinovirus / Enterovirus NOT DETECTED NOT DETECTED Final   Influenza A NOT DETECTED NOT DETECTED Final   Influenza B NOT DETECTED NOT DETECTED Final   Parainfluenza Virus 1 NOT DETECTED NOT DETECTED Final   Parainfluenza Virus 2 NOT DETECTED NOT DETECTED Final   Parainfluenza Virus 3 NOT DETECTED NOT DETECTED Final   Parainfluenza Virus 4 NOT DETECTED NOT DETECTED Final   Respiratory Syncytial Virus NOT DETECTED NOT DETECTED Final   Bordetella pertussis NOT DETECTED NOT DETECTED Final    Bordetella Parapertussis NOT DETECTED NOT DETECTED Final   Chlamydophila pneumoniae NOT DETECTED NOT DETECTED Final   Mycoplasma pneumoniae NOT DETECTED NOT DETECTED Final    Comment: Performed at [facility], [address], [place], [place] [address]    Labs: CBC: Recent Labs  Lab 09/07/22 2050 09/08/22 0740 09/09/22 0455  WBC 10.0 8.6 13.2*  NEUTROABS 6.6  --   --   HGB 12.9* 12.3* 12.5*  HCT 40.2 39.1 39.2  MCV 82.7 84.1 83.6  PLT 255 236 273   Basic Metabolic Panel: Recent Labs  Lab 09/07/22 2050 09/08/22 0740 09/09/22 0455  NA 134*  --  135  K 3.8  --  4.8  CL 100  --  102  CO2 23  --  24  GLUCOSE 195*  --  348*  BUN 11  --  23*  CREATININE 0.90 0.91 0.88  CALCIUM 8.7*  --  8.6*   Liver Function Tests: No results for input(s): "AST", "ALT", "ALKPHOS", "BILITOT", "PROT", "ALBUMIN" in the last 168 hours. CBG: Recent Labs  Lab 09/08/22 1232 09/08/22 1633 09/08/22 2137 09/09/22 0721 09/09/22 1140  GLUCAP 369* 239* 319* 335* 330*    Discharge time spent: greater than 30 minutes.  Signed: Marinda Elk, MD [facility] 09/09/2022

## 2022-09-09 NOTE — Progress Notes (Signed)
Nurse reviewed discharge instructions with pt.  Pt verbalized understanding of discharge instructions, follow up appointments and new medications.  Reached out to SW due to pt not being able to afford his medications due to insurance issues.  Pt given a Match voucher prior to discharge.  Pt to pick his medication up from the [facility].

## 2022-09-09 NOTE — TOC Progression Note (Signed)
Transition of Riley [facility]) - Progression Note    Patient Details  Name: Julian Riley MRN: [id number] Date of Birth: [birth year]  Transition of [facility]) CM/SW Contact  Julian Riley, Julian Pulse, RN Phone Number: 09/09/2022, 2:49 PM  Clinical Narrative:   [place] Medication Assistance Card  Name: Julian Riley (MRN): [id number] [place]: [id number] RX Group: [id number] Discharge Date: 09/09/22 Expiration Date:09/16/22                                          (must be filled within 7 days of discharge)     You have been approved to have the prescriptions written by your discharging physician filled through our [facility] (Medication Assistance Through [facility]) program. This program allows for a one-time (no refills) 34-day supply of selected medications for a low copay amount.  The copay is $3.00 per prescription. For instance, if you have one prescription, you will pay $3.00; for two prescriptions, you pay $6.00; for three prescriptions, you pay $9.00; and so on.  Only certain pharmacies are participating in this program with [facility]. You will need to select one of the pharmacies from the attached list and take your prescriptions, this letter, and your photo ID to one of the [place] pharmacies, [facility], [facility] at [address] or [facility] [address].   We are excited that you are able to use the [facility] program to get your medications. These prescriptions must be filled within 7 days of hospital discharge or they will no longer be valid for the [facility] program. Should you have any problems with your prescriptions please contact your case management team member at [phone] for Julian Riley  Mobile#[phone] Office [phone]       Barriers to Discharge: No Barriers Identified  Expected  Discharge Plan and Services         Expected Discharge Date: 09/09/22                                     Social Determinants of Health (SDOH) Interventions SDOH Screenings   Tobacco Use: Low Risk  (09/08/2022)    Readmission Risk Interventions     No data to display

## 2022-09-09 NOTE — Discharge Instructions (Addendum)
Please take all prescribed medications exactly as instructed including the remaining course of your steroids and antibiotics Please use your inhalers as instructed Please go to all follow-up appointments including following up with your primary care provider in 1 week and following up with Dr. Verlee Monte with pulmonology.  You will be contacted by pulmonology clinic with an appointment in the coming days. Please return to the emergency department if you develop worsening shortness of breath, chest pain, fevers or inability to tolerate oral intake.

## 2022-09-10 ENCOUNTER — Other Ambulatory Visit (HOSPITAL_COMMUNITY): Payer: Self-pay

## 2022-09-10 LAB — LEGIONELLA PNEUMOPHILA SEROGP 1 UR AG: L. pneumophila Serogp 1 Ur Ag: NEGATIVE

## 2022-09-22 ENCOUNTER — Other Ambulatory Visit (HOSPITAL_COMMUNITY): Payer: Self-pay

## 2022-09-24 ENCOUNTER — Other Ambulatory Visit (HOSPITAL_COMMUNITY): Payer: Self-pay

## 2022-11-09 DIAGNOSIS — E119 Type 2 diabetes mellitus without complications: Secondary | ICD-10-CM | POA: Diagnosis not present

## 2022-11-09 DIAGNOSIS — E785 Hyperlipidemia, unspecified: Secondary | ICD-10-CM | POA: Diagnosis not present

## 2022-11-09 DIAGNOSIS — I10 Essential (primary) hypertension: Secondary | ICD-10-CM | POA: Diagnosis not present

## 2022-12-14 ENCOUNTER — Inpatient Hospital Stay: Admit: 2022-12-14 | Payer: Self-pay
